# Patient Record
Sex: Female | Born: 1980 | Race: White | Hispanic: No | Marital: Single | Smoking: Former smoker
Health system: Southern US, Community
[De-identification: ages and names within clinical notes are randomized; demographics above are authoritative.]

## PROBLEM LIST (undated history)

## (undated) DIAGNOSIS — Z915 Personal history of self-harm: Secondary | ICD-10-CM

## (undated) DIAGNOSIS — G5601 Carpal tunnel syndrome, right upper limb: Secondary | ICD-10-CM

## (undated) DIAGNOSIS — Z9151 Personal history of suicidal behavior: Secondary | ICD-10-CM

## (undated) DIAGNOSIS — K769 Liver disease, unspecified: Secondary | ICD-10-CM

## (undated) DIAGNOSIS — N809 Endometriosis, unspecified: Secondary | ICD-10-CM

## (undated) DIAGNOSIS — K5790 Diverticulosis of intestine, part unspecified, without perforation or abscess without bleeding: Secondary | ICD-10-CM

---

## 2000-01-15 ENCOUNTER — Emergency Department (HOSPITAL_COMMUNITY): Admission: EM | Admit: 2000-01-15 | Discharge: 2000-01-15 | Payer: Self-pay | Admitting: *Deleted

## 2000-11-22 ENCOUNTER — Emergency Department (HOSPITAL_COMMUNITY): Admission: EM | Admit: 2000-11-22 | Discharge: 2000-11-22 | Payer: Self-pay | Admitting: Emergency Medicine

## 2000-12-05 ENCOUNTER — Emergency Department (HOSPITAL_COMMUNITY): Admission: EM | Admit: 2000-12-05 | Discharge: 2000-12-05 | Payer: Self-pay | Admitting: Emergency Medicine

## 2001-04-02 ENCOUNTER — Emergency Department (HOSPITAL_COMMUNITY): Admission: EM | Admit: 2001-04-02 | Discharge: 2001-04-02 | Payer: Self-pay | Admitting: Emergency Medicine

## 2002-08-25 ENCOUNTER — Emergency Department (HOSPITAL_COMMUNITY): Admission: EM | Admit: 2002-08-25 | Discharge: 2002-08-25 | Payer: Self-pay | Admitting: Emergency Medicine

## 2002-10-29 ENCOUNTER — Emergency Department (HOSPITAL_COMMUNITY): Admission: EM | Admit: 2002-10-29 | Discharge: 2002-10-29 | Payer: Self-pay | Admitting: Emergency Medicine

## 2002-12-08 ENCOUNTER — Emergency Department (HOSPITAL_COMMUNITY): Admission: EM | Admit: 2002-12-08 | Discharge: 2002-12-08 | Payer: Self-pay

## 2002-12-17 ENCOUNTER — Emergency Department (HOSPITAL_COMMUNITY): Admission: EM | Admit: 2002-12-17 | Discharge: 2002-12-17 | Payer: Self-pay | Admitting: Emergency Medicine

## 2003-02-03 ENCOUNTER — Inpatient Hospital Stay (HOSPITAL_COMMUNITY): Admission: AD | Admit: 2003-02-03 | Discharge: 2003-02-03 | Payer: Self-pay | Admitting: Obstetrics and Gynecology

## 2003-02-03 ENCOUNTER — Encounter: Payer: Self-pay | Admitting: Emergency Medicine

## 2003-02-08 ENCOUNTER — Emergency Department (HOSPITAL_COMMUNITY): Admission: EM | Admit: 2003-02-08 | Discharge: 2003-02-09 | Payer: Self-pay | Admitting: Emergency Medicine

## 2003-02-10 ENCOUNTER — Inpatient Hospital Stay (HOSPITAL_COMMUNITY): Admission: AD | Admit: 2003-02-10 | Discharge: 2003-02-10 | Payer: Self-pay | Admitting: Obstetrics and Gynecology

## 2003-02-13 ENCOUNTER — Inpatient Hospital Stay (HOSPITAL_COMMUNITY): Admission: AD | Admit: 2003-02-13 | Discharge: 2003-02-13 | Payer: Self-pay | Admitting: Family Medicine

## 2003-02-17 ENCOUNTER — Inpatient Hospital Stay (HOSPITAL_COMMUNITY): Admission: AD | Admit: 2003-02-17 | Discharge: 2003-02-17 | Payer: Self-pay | Admitting: Obstetrics and Gynecology

## 2003-03-12 ENCOUNTER — Inpatient Hospital Stay (HOSPITAL_COMMUNITY): Admission: AD | Admit: 2003-03-12 | Discharge: 2003-03-12 | Payer: Self-pay | Admitting: *Deleted

## 2003-03-28 ENCOUNTER — Emergency Department (HOSPITAL_COMMUNITY): Admission: EM | Admit: 2003-03-28 | Discharge: 2003-03-28 | Payer: Self-pay | Admitting: Emergency Medicine

## 2003-04-02 ENCOUNTER — Emergency Department (HOSPITAL_COMMUNITY): Admission: EM | Admit: 2003-04-02 | Discharge: 2003-04-02 | Payer: Self-pay | Admitting: Family Medicine

## 2003-04-09 ENCOUNTER — Emergency Department (HOSPITAL_COMMUNITY): Admission: EM | Admit: 2003-04-09 | Discharge: 2003-04-09 | Payer: Self-pay | Admitting: Family Medicine

## 2003-04-15 ENCOUNTER — Emergency Department (HOSPITAL_COMMUNITY): Admission: AD | Admit: 2003-04-15 | Discharge: 2003-04-15 | Payer: Self-pay | Admitting: Family Medicine

## 2003-06-29 ENCOUNTER — Inpatient Hospital Stay (HOSPITAL_COMMUNITY): Admission: AD | Admit: 2003-06-29 | Discharge: 2003-06-29 | Payer: Self-pay | Admitting: *Deleted

## 2003-08-29 ENCOUNTER — Emergency Department (HOSPITAL_COMMUNITY): Admission: EM | Admit: 2003-08-29 | Discharge: 2003-08-29 | Payer: Self-pay | Admitting: Emergency Medicine

## 2003-09-20 ENCOUNTER — Inpatient Hospital Stay (HOSPITAL_COMMUNITY): Admission: AD | Admit: 2003-09-20 | Discharge: 2003-09-21 | Payer: Self-pay | Admitting: Obstetrics and Gynecology

## 2003-09-21 ENCOUNTER — Inpatient Hospital Stay (HOSPITAL_COMMUNITY): Admission: AD | Admit: 2003-09-21 | Discharge: 2003-09-21 | Payer: Self-pay | Admitting: *Deleted

## 2003-09-23 ENCOUNTER — Inpatient Hospital Stay (HOSPITAL_COMMUNITY): Admission: AD | Admit: 2003-09-23 | Discharge: 2003-09-23 | Payer: Self-pay | Admitting: Obstetrics and Gynecology

## 2003-09-25 ENCOUNTER — Inpatient Hospital Stay (HOSPITAL_COMMUNITY): Admission: AD | Admit: 2003-09-25 | Discharge: 2003-09-25 | Payer: Self-pay | Admitting: Obstetrics & Gynecology

## 2003-10-07 ENCOUNTER — Inpatient Hospital Stay (HOSPITAL_COMMUNITY): Admission: AD | Admit: 2003-10-07 | Discharge: 2003-10-07 | Payer: Self-pay | Admitting: Gynecology

## 2003-12-27 ENCOUNTER — Inpatient Hospital Stay (HOSPITAL_COMMUNITY): Admission: AD | Admit: 2003-12-27 | Discharge: 2003-12-27 | Payer: Self-pay | Admitting: Obstetrics and Gynecology

## 2004-01-19 ENCOUNTER — Inpatient Hospital Stay (HOSPITAL_COMMUNITY): Admission: AD | Admit: 2004-01-19 | Discharge: 2004-01-19 | Payer: Self-pay | Admitting: Obstetrics and Gynecology

## 2004-01-27 ENCOUNTER — Inpatient Hospital Stay (HOSPITAL_COMMUNITY): Admission: AD | Admit: 2004-01-27 | Discharge: 2004-01-27 | Payer: Self-pay | Admitting: *Deleted

## 2004-02-08 ENCOUNTER — Ambulatory Visit: Payer: Self-pay | Admitting: Obstetrics and Gynecology

## 2004-03-13 ENCOUNTER — Ambulatory Visit: Payer: Self-pay | Admitting: Obstetrics and Gynecology

## 2004-03-13 ENCOUNTER — Ambulatory Visit (HOSPITAL_COMMUNITY): Admission: RE | Admit: 2004-03-13 | Discharge: 2004-03-13 | Payer: Self-pay | Admitting: Obstetrics and Gynecology

## 2004-03-13 HISTORY — PX: DIAGNOSTIC LAPAROSCOPY: SUR761

## 2004-03-15 ENCOUNTER — Inpatient Hospital Stay (HOSPITAL_COMMUNITY): Admission: AD | Admit: 2004-03-15 | Discharge: 2004-03-15 | Payer: Self-pay | Admitting: Family Medicine

## 2004-03-30 ENCOUNTER — Ambulatory Visit: Payer: Self-pay | Admitting: Obstetrics and Gynecology

## 2004-05-15 ENCOUNTER — Emergency Department (HOSPITAL_COMMUNITY): Admission: EM | Admit: 2004-05-15 | Discharge: 2004-05-15 | Payer: Self-pay | Admitting: Family Medicine

## 2004-05-23 ENCOUNTER — Emergency Department (HOSPITAL_COMMUNITY): Admission: EM | Admit: 2004-05-23 | Discharge: 2004-05-23 | Payer: Self-pay | Admitting: Family Medicine

## 2004-06-20 ENCOUNTER — Ambulatory Visit: Payer: Self-pay | Admitting: *Deleted

## 2004-07-20 ENCOUNTER — Emergency Department (HOSPITAL_COMMUNITY): Admission: EM | Admit: 2004-07-20 | Discharge: 2004-07-20 | Payer: Self-pay | Admitting: Emergency Medicine

## 2004-08-15 ENCOUNTER — Emergency Department (HOSPITAL_COMMUNITY): Admission: EM | Admit: 2004-08-15 | Discharge: 2004-08-15 | Payer: Self-pay | Admitting: Family Medicine

## 2004-08-18 ENCOUNTER — Emergency Department (HOSPITAL_COMMUNITY): Admission: EM | Admit: 2004-08-18 | Discharge: 2004-08-18 | Payer: Self-pay | Admitting: Family Medicine

## 2004-08-19 ENCOUNTER — Emergency Department (HOSPITAL_COMMUNITY): Admission: EM | Admit: 2004-08-19 | Discharge: 2004-08-19 | Payer: Self-pay | Admitting: Emergency Medicine

## 2004-08-20 ENCOUNTER — Emergency Department (HOSPITAL_COMMUNITY): Admission: EM | Admit: 2004-08-20 | Discharge: 2004-08-20 | Payer: Self-pay | Admitting: Family Medicine

## 2004-09-11 ENCOUNTER — Emergency Department (HOSPITAL_COMMUNITY): Admission: EM | Admit: 2004-09-11 | Discharge: 2004-09-11 | Payer: Self-pay | Admitting: *Deleted

## 2004-09-11 ENCOUNTER — Encounter: Payer: Self-pay | Admitting: Obstetrics and Gynecology

## 2004-10-11 ENCOUNTER — Emergency Department (HOSPITAL_COMMUNITY): Admission: EM | Admit: 2004-10-11 | Discharge: 2004-10-11 | Payer: Self-pay | Admitting: Family Medicine

## 2004-11-19 ENCOUNTER — Inpatient Hospital Stay (HOSPITAL_COMMUNITY): Admission: AD | Admit: 2004-11-19 | Discharge: 2004-11-19 | Payer: Self-pay | Admitting: Obstetrics and Gynecology

## 2004-11-24 ENCOUNTER — Emergency Department (HOSPITAL_COMMUNITY): Admission: EM | Admit: 2004-11-24 | Discharge: 2004-11-24 | Payer: Self-pay | Admitting: Emergency Medicine

## 2005-01-30 ENCOUNTER — Emergency Department (HOSPITAL_COMMUNITY): Admission: EM | Admit: 2005-01-30 | Discharge: 2005-01-30 | Payer: Self-pay | Admitting: Family Medicine

## 2005-02-25 ENCOUNTER — Emergency Department (HOSPITAL_COMMUNITY): Admission: EM | Admit: 2005-02-25 | Discharge: 2005-02-25 | Payer: Self-pay | Admitting: Family Medicine

## 2005-02-26 ENCOUNTER — Ambulatory Visit (HOSPITAL_COMMUNITY): Admission: RE | Admit: 2005-02-26 | Discharge: 2005-02-26 | Payer: Self-pay | Admitting: Family Medicine

## 2005-02-28 ENCOUNTER — Inpatient Hospital Stay (HOSPITAL_COMMUNITY): Admission: AD | Admit: 2005-02-28 | Discharge: 2005-02-28 | Payer: Self-pay | Admitting: Obstetrics and Gynecology

## 2005-03-01 ENCOUNTER — Emergency Department (HOSPITAL_COMMUNITY): Admission: EM | Admit: 2005-03-01 | Discharge: 2005-03-02 | Payer: Self-pay | Admitting: Emergency Medicine

## 2005-03-21 ENCOUNTER — Emergency Department (HOSPITAL_COMMUNITY): Admission: EM | Admit: 2005-03-21 | Discharge: 2005-03-21 | Payer: Self-pay | Admitting: Emergency Medicine

## 2005-05-11 ENCOUNTER — Emergency Department (HOSPITAL_COMMUNITY): Admission: EM | Admit: 2005-05-11 | Discharge: 2005-05-11 | Payer: Self-pay | Admitting: Family Medicine

## 2005-06-25 ENCOUNTER — Emergency Department (HOSPITAL_COMMUNITY): Admission: EM | Admit: 2005-06-25 | Discharge: 2005-06-25 | Payer: Self-pay | Admitting: Emergency Medicine

## 2005-08-03 ENCOUNTER — Emergency Department (HOSPITAL_COMMUNITY): Admission: EM | Admit: 2005-08-03 | Discharge: 2005-08-03 | Payer: Self-pay | Admitting: Emergency Medicine

## 2005-08-24 ENCOUNTER — Encounter (INDEPENDENT_AMBULATORY_CARE_PROVIDER_SITE_OTHER): Payer: Self-pay | Admitting: Specialist

## 2005-08-24 ENCOUNTER — Ambulatory Visit: Payer: Self-pay | Admitting: Family Medicine

## 2005-08-24 ENCOUNTER — Ambulatory Visit (HOSPITAL_COMMUNITY): Admission: AD | Admit: 2005-08-24 | Discharge: 2005-08-24 | Payer: Self-pay | Admitting: Family Medicine

## 2005-08-24 HISTORY — PX: SALPINGECTOMY: SHX328

## 2005-08-26 ENCOUNTER — Inpatient Hospital Stay (HOSPITAL_COMMUNITY): Admission: AD | Admit: 2005-08-26 | Discharge: 2005-08-26 | Payer: Self-pay | Admitting: Obstetrics and Gynecology

## 2005-08-28 ENCOUNTER — Inpatient Hospital Stay (HOSPITAL_COMMUNITY): Admission: AD | Admit: 2005-08-28 | Discharge: 2005-08-29 | Payer: Self-pay | Admitting: Obstetrics and Gynecology

## 2005-08-30 ENCOUNTER — Inpatient Hospital Stay (HOSPITAL_COMMUNITY): Admission: AD | Admit: 2005-08-30 | Discharge: 2005-08-30 | Payer: Self-pay | Admitting: Family Medicine

## 2005-09-05 ENCOUNTER — Emergency Department (HOSPITAL_COMMUNITY): Admission: EM | Admit: 2005-09-05 | Discharge: 2005-09-05 | Payer: Self-pay | Admitting: Family Medicine

## 2005-09-12 ENCOUNTER — Ambulatory Visit: Payer: Self-pay | Admitting: Gynecology

## 2006-06-19 IMAGING — CT CT PELVIS W/O CM
1 of 2 series · 15 of 32 positions shown, 20 images · IV contrast (agent unspecified)
Comparison: none

CLINICAL DATA: Left flank pain.
 ABDOMEN CT WITHOUT CONTRAST:
TECHNIQUE: Multidetector CT imaging of the abdomen was performed following the standard protocol without IV contrast.  
 There is a calcified granuloma in the spleen.  The spleen is otherwise normal.  The liver, pancreas, adrenal glands, and kidneys appear normal.  No renal calculus or hydronephrosis.  No dilated loops of large or small bowel.  The bony structures are normal.  The appendix and terminal ileum are normal.
TECHNIQUE: Multidetector CT imaging of the pelvis was performed following the standard protocol without IV contrast. 
 The uterus and ovaries are normal.  No distal ureteral calculi or bladder calculi.  No bony abnormality of significance.

[Series 2: routine abdomen · axial · 0.69mm/px · z∈[-438,-68]mm · 15 of 82 slices shown, 20 images]
[im 4/82  soft-tissue]
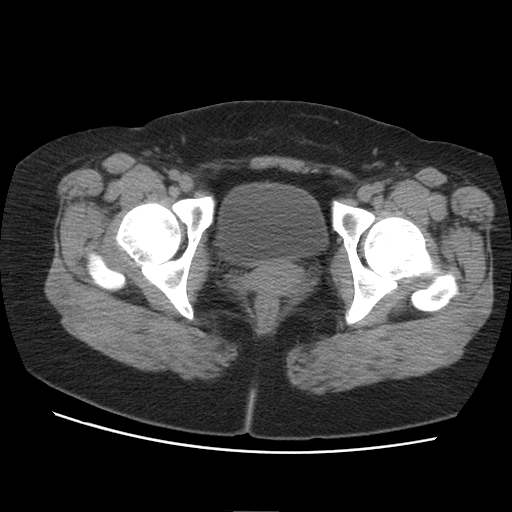
[im 4/82  bone]
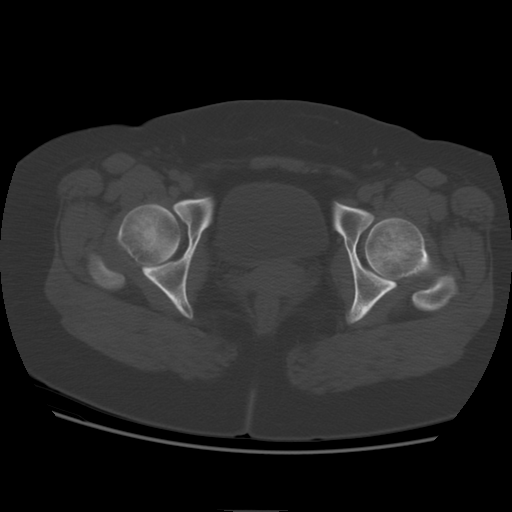
[im 11/82  soft-tissue]
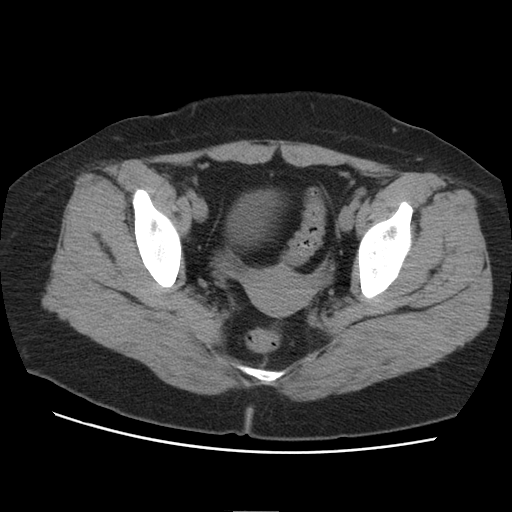
[im 15/82  soft-tissue]
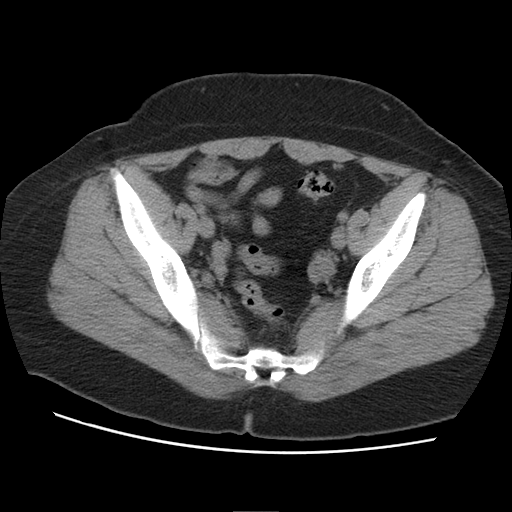
[im 22/82  soft-tissue]
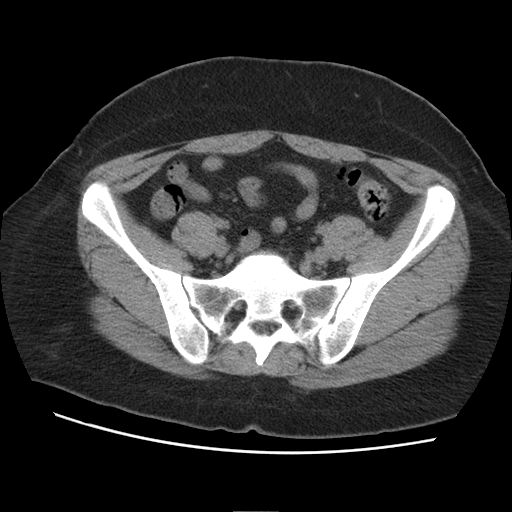
[im 29/82  soft-tissue]
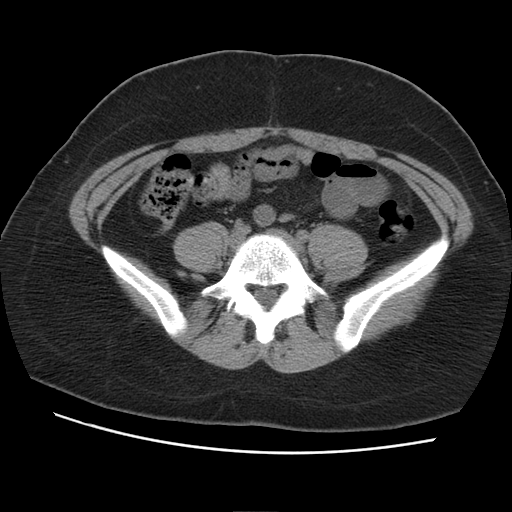
[im 32/82  soft-tissue]
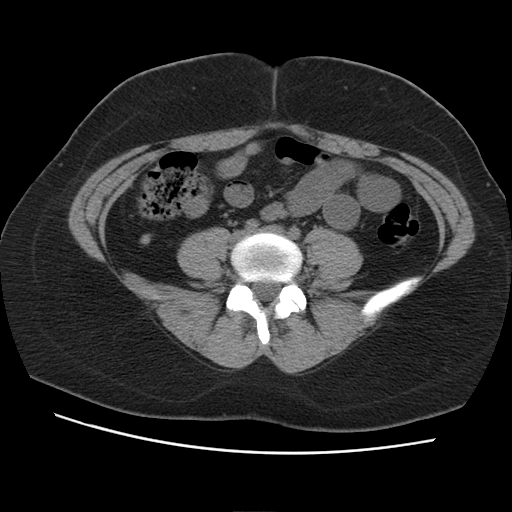
[im 39/82  soft-tissue]
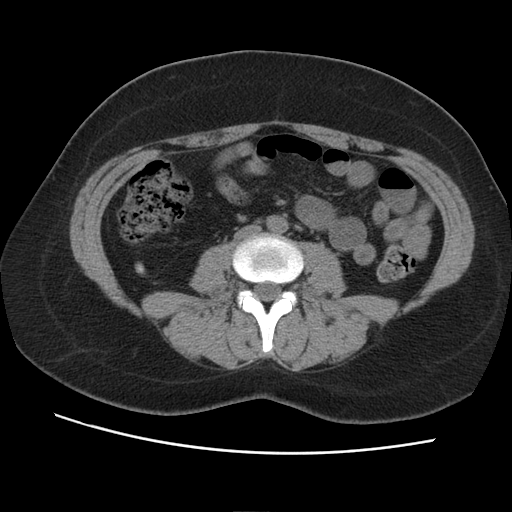
[im 43/82  soft-tissue]
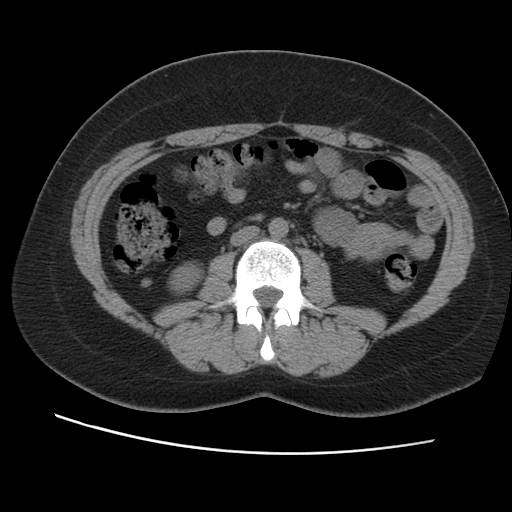
[im 50/82  soft-tissue]
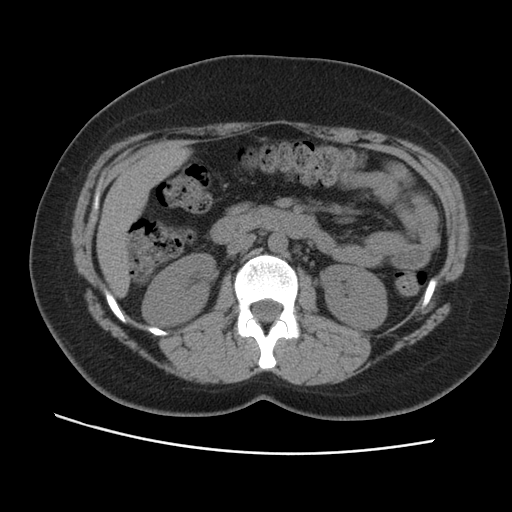
[im 50/82  bone]
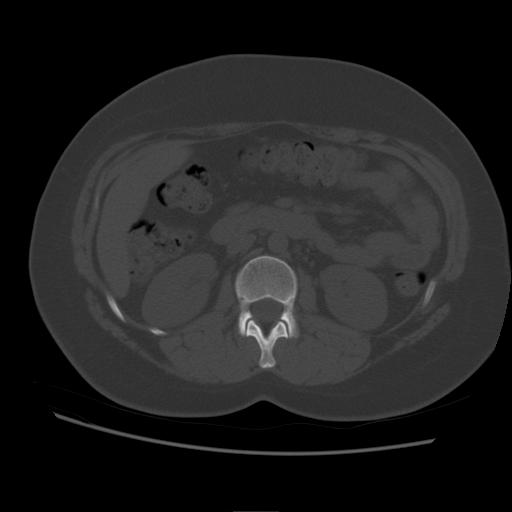
[im 53/82  soft-tissue]
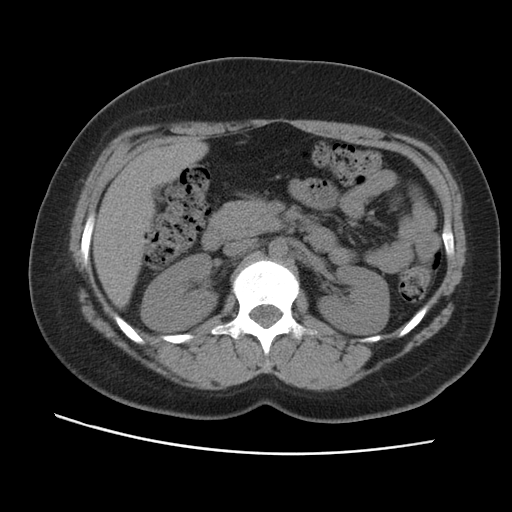
[im 60/82  soft-tissue]
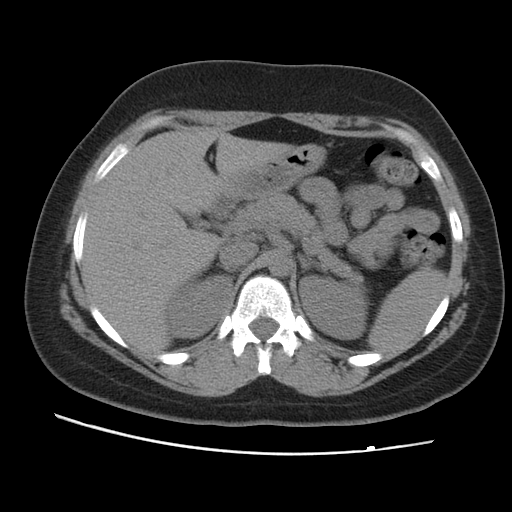
[im 67/82  soft-tissue]
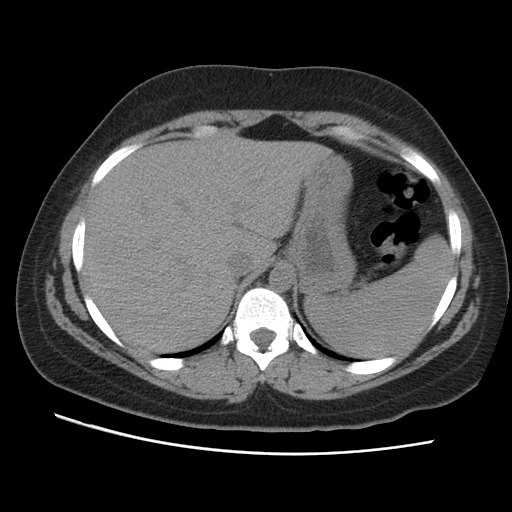
[im 67/82  lung]
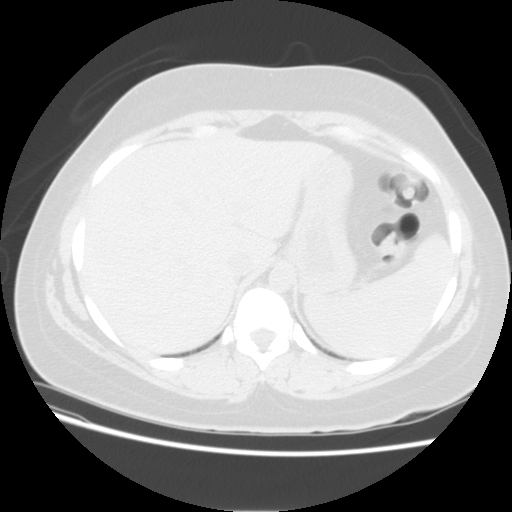
[im 71/82  soft-tissue]
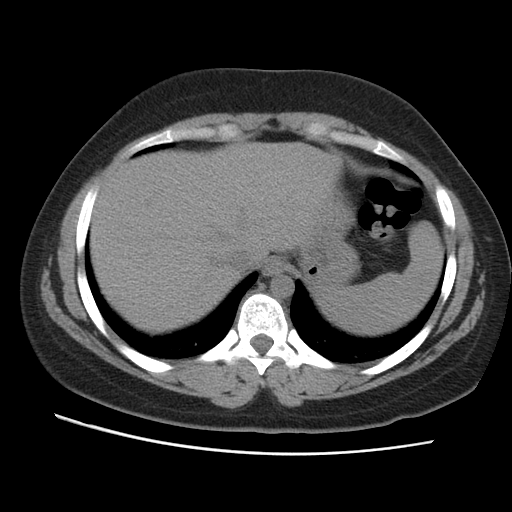
[im 71/82  lung]
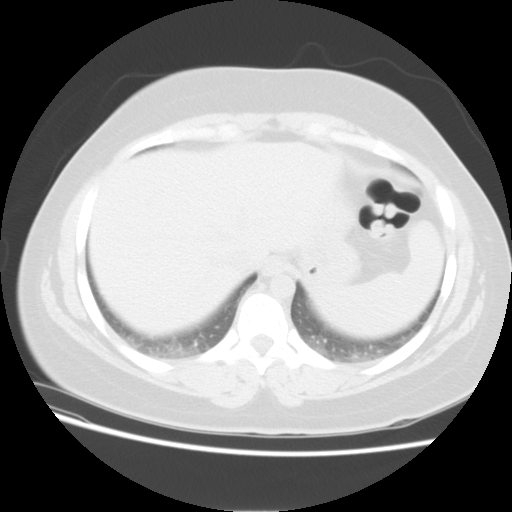
[im 74/82  lung]
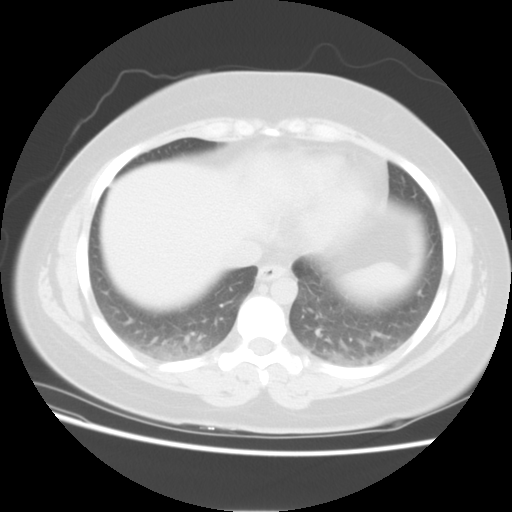
[im 78/82  soft-tissue]
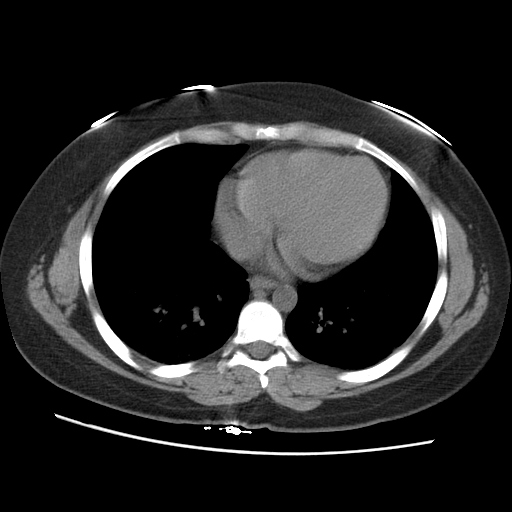
[im 78/82  lung]
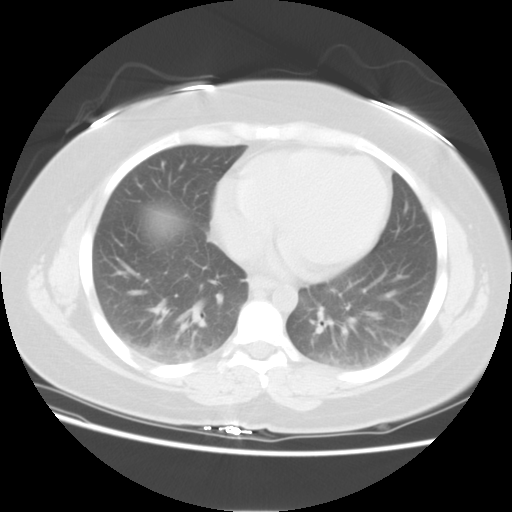

[15 of 32 positions shown; findings below may reference images not displayed]

IMPRESSION: Normal CT scan of the pelvis. 
 PELVIS CT WITHOUT CONTRAST:
IMPRESSION: Normal exam.

## 2007-05-23 ENCOUNTER — Inpatient Hospital Stay (HOSPITAL_COMMUNITY): Admission: AD | Admit: 2007-05-23 | Discharge: 2007-05-23 | Payer: Self-pay | Admitting: Gynecology

## 2007-06-03 IMAGING — CR DG ABDOMEN 1V
1 series · 1 of 1 positions shown · non-contrast
Comparison: CT of 09/11/04.

CLINICAL DATA: Ectopic pregnancy.  Surgery two days ago.  Now having periumbilical pain, nausea, and vomiting. 
 ABDOMEN ? 1 VIEW:

[view not recorded]
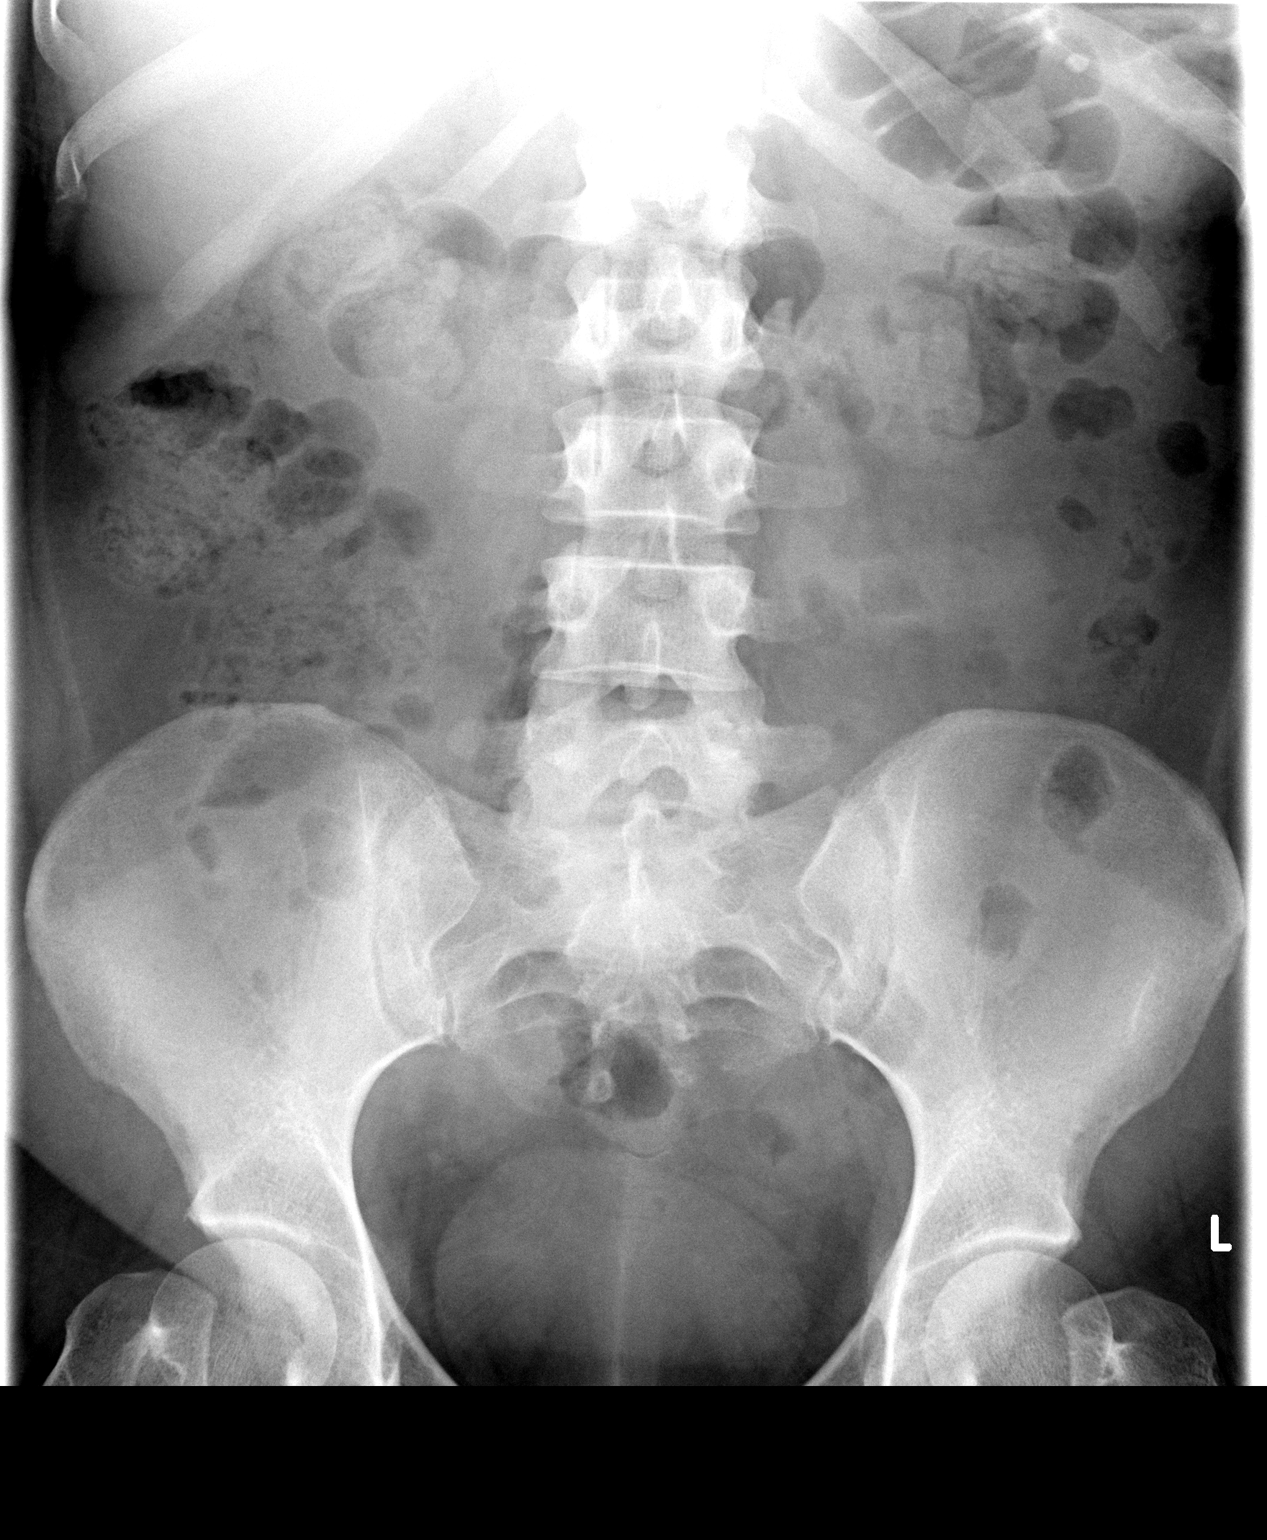

[1 of 1 positions shown; findings below may reference images not displayed]

FINDINGS: Unremarkable bowel gas pattern.  Stool and gas throughout the colon.  Distal gas identified. No significant small bowel distention.  No abnormal calcifications.  No evidence of free intraperitoneal air.
IMPRESSION: No evidence of bowel obstruction or free air.

## 2007-10-30 ENCOUNTER — Inpatient Hospital Stay (HOSPITAL_COMMUNITY): Admission: AD | Admit: 2007-10-30 | Discharge: 2007-10-30 | Payer: Self-pay | Admitting: Gynecology

## 2009-02-27 IMAGING — US US PELVIS COMPLETE MODIFY
1 series · 14 of 25 positions shown · non-contrast
Comparison: CT abdomen and pelvis 09/11/2004.

CLINICAL DATA: Left lower quadrant abdominal pain and left pelvic
pain.  History of free ectopic pregnancies, status post left
salpingectomy.  LMP 04/03/2007.

TRANSABDOMINAL AND TRANSVAGINAL ULTRASOUND OF PELVIS 05/23/2007:
TECHNIQUE: Both transabdominal and transvaginal ultrasound
examinations of the pelvis were performed including evaluation of
the uterus, ovaries, adnexal regions, and pelvic cul-de-sac.

[Series 1: us pelvis complete modify · 0.26mm/px · 14 of 39 slices shown]
[im 1/39]
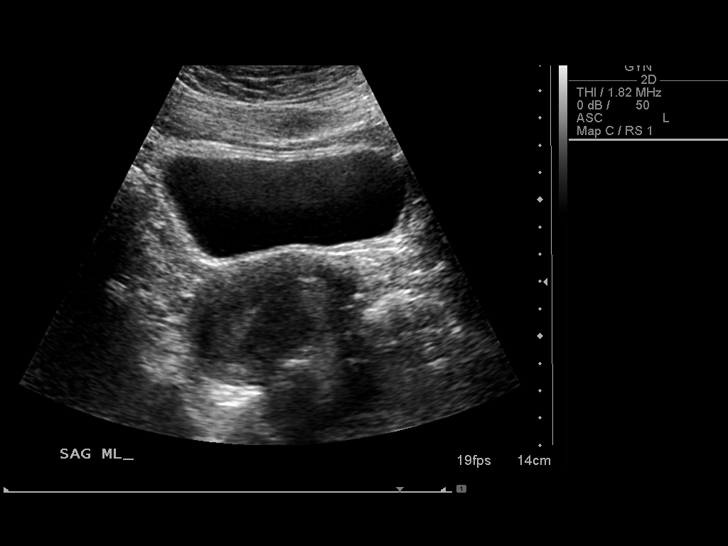
[im 4/39]
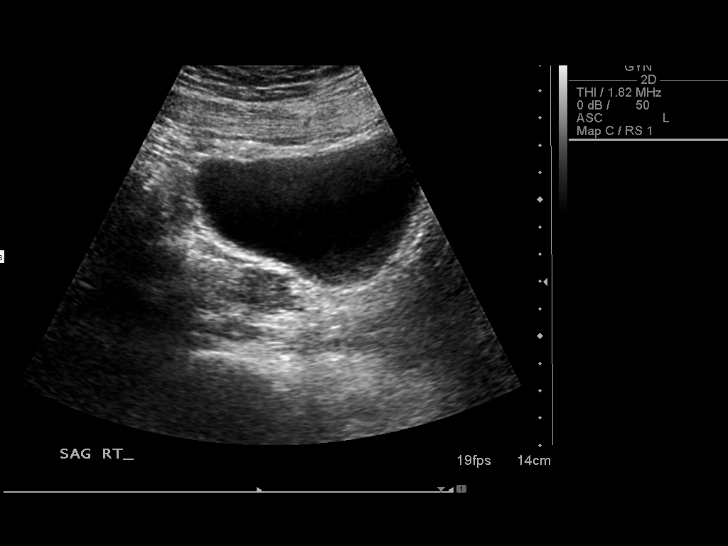
[im 7/39]
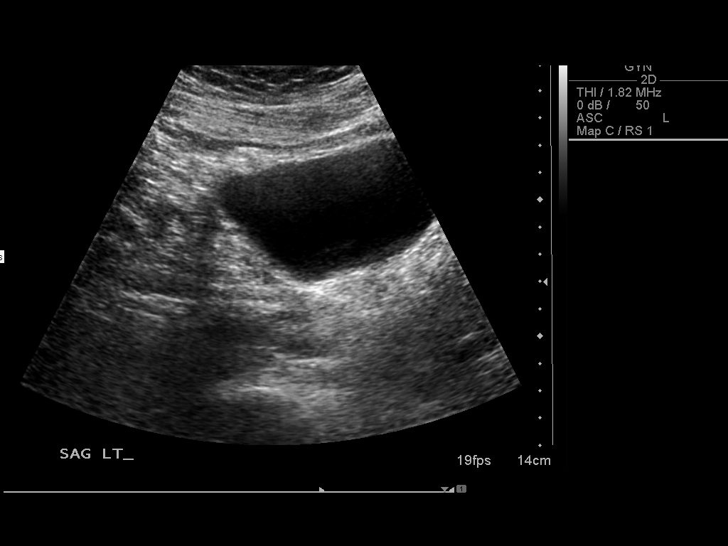
[im 10/39]
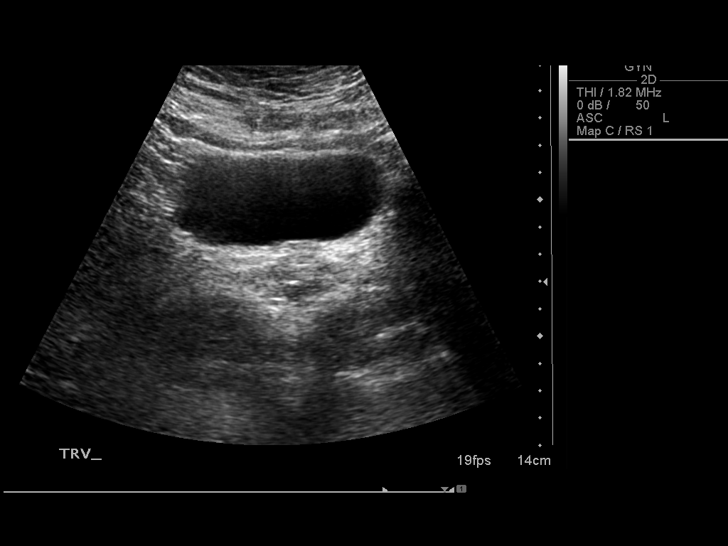
[im 13/39]
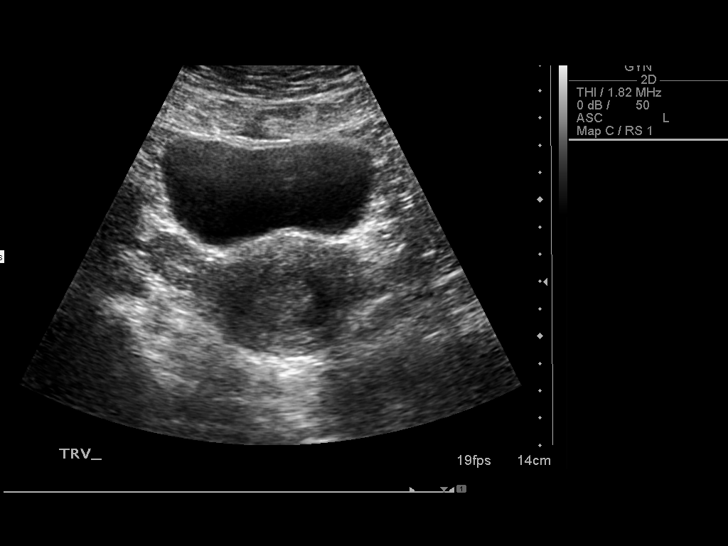
[im 15/39]
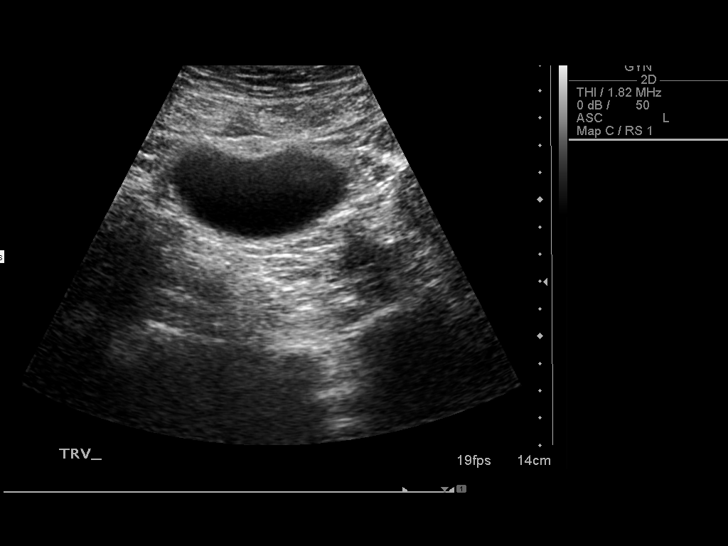
[im 18/39]
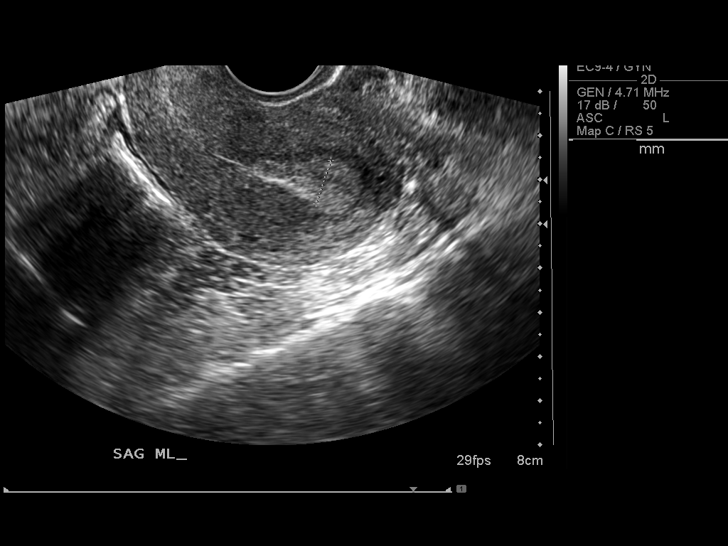
[im 21/39]
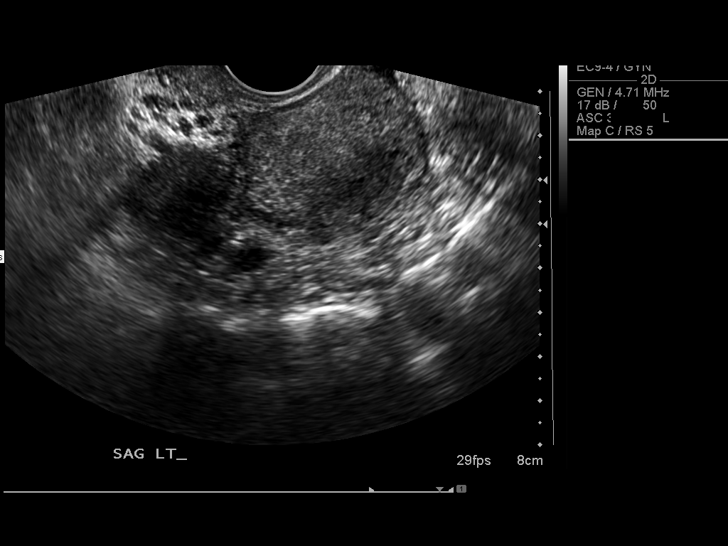
[im 24/39]
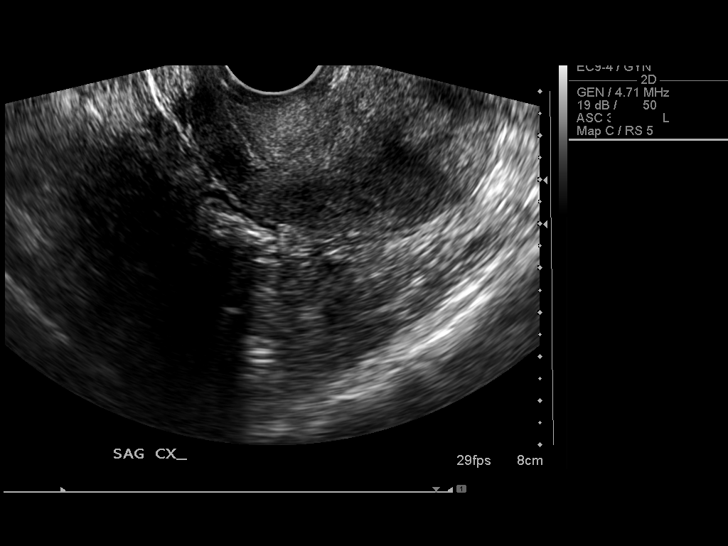
[im 26/39]
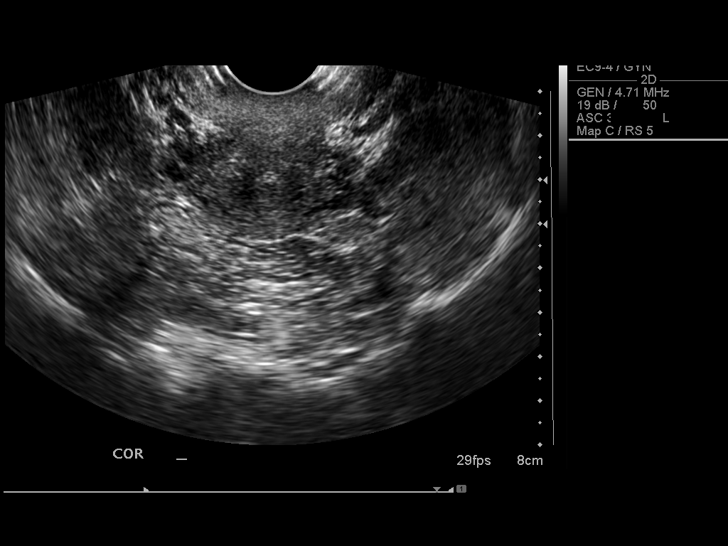
[im 29/39]
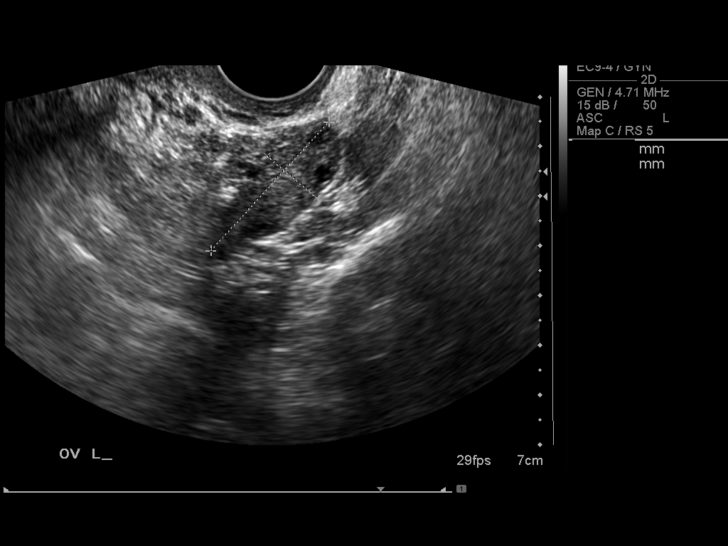
[im 32/39]
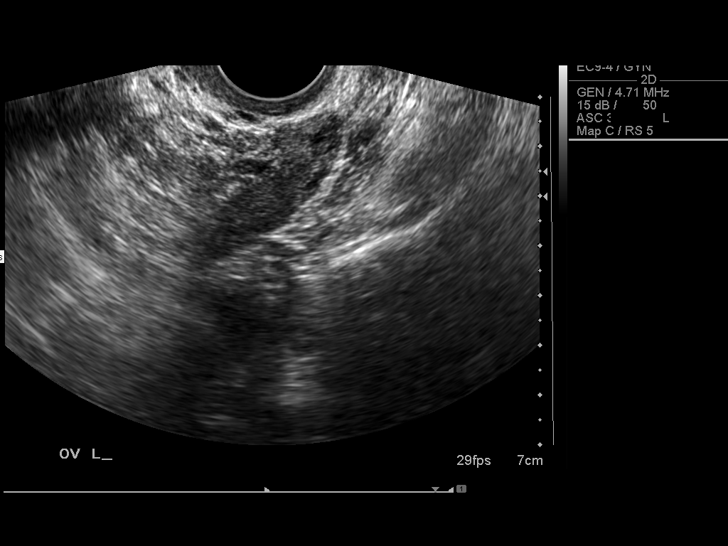
[im 35/39]
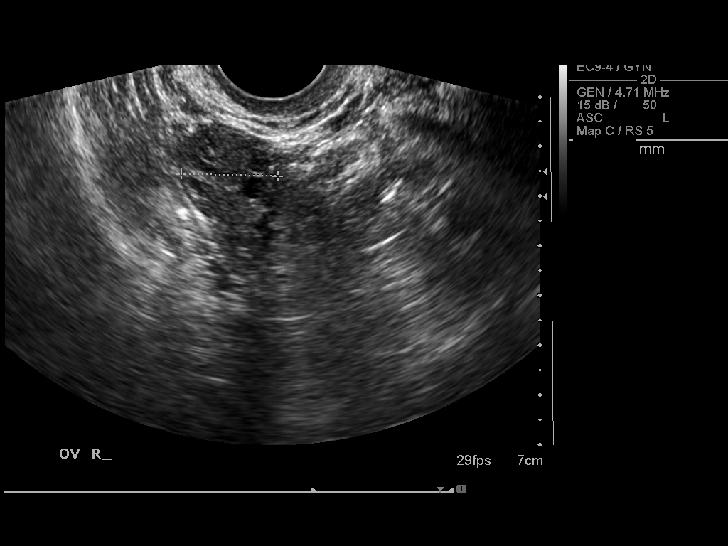
[im 39/39]
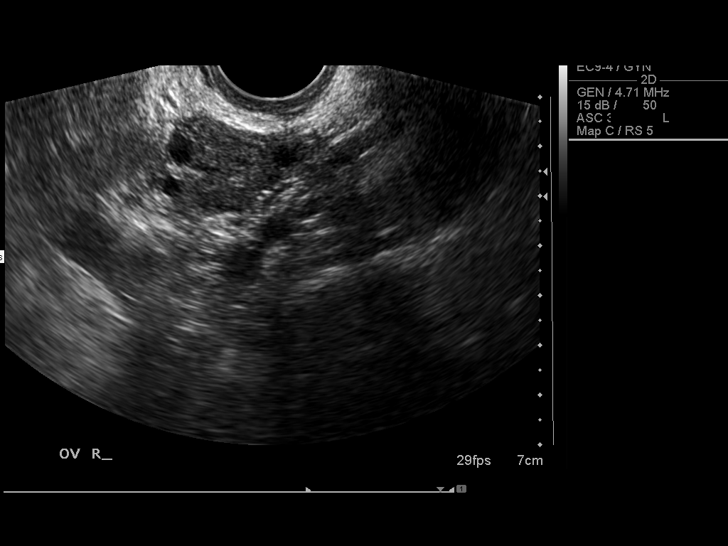

[14 of 25 positions shown; findings below may reference images not displayed]

FINDINGS: Uterus normal in size and appearance, measuring
approximately 7.0 x 4.1 x 5.1 cm.  Endometrium normal in appearance
measuring approximately 8 mm in thickness.  No endometrial fluid or
masses.  No myometrial masses.  Uterine cervix unremarkable.

Left ovary normal in size and appearance measuring approximately
3.5 x 1.3 x 1.3 cm.  It contains several small follicular cysts.
Right ovary normal in size and appearance measuring approximately
3.0 x 1.8 x 1.9 cm.  It also contains several small follicular
cysts.  No adnexal masses or free fluid.
IMPRESSION: Normal pelvic ultrasound.

## 2009-04-13 ENCOUNTER — Emergency Department (HOSPITAL_COMMUNITY): Admission: EM | Admit: 2009-04-13 | Discharge: 2009-04-13 | Payer: Self-pay | Admitting: Emergency Medicine

## 2010-06-30 NOTE — Op Note (Signed)
Karen Flowers, Karen Flowers              ACCOUNT NO.:  [id number]   MEDICAL RECORD NO.:  [id number]          PATIENT TYPE:  AMB   LOCATION:  [facility]                           FACILITY:  [facility]   PHYSICIAN:  Phil D. Okey Dupre, M.D.     DATE OF BIRTH:  07-16-1980   DATE OF PROCEDURE:  03/13/2004  DATE OF DISCHARGE:                                 OPERATIVE REPORT   PROCEDURE:  Diagnostic laparoscopy and examination under anesthesia.   PREOPERATIVE DIAGNOSES:  Chronic pelvic pain with increasing dysmenorrhea  and dyspareunia.   POSTOPERATIVE DIAGNOSES:  Normal pelvis with minimal endometriosis.   ANESTHESIA:  General.   ESTIMATED BLOOD LOSS:  Minimal.   SURGEON:  Dr. Okey Dupre.   SURGICAL SPECIMENS:  No surgical specimens taken.   OPERATIVE FINDINGS:  Bimanual pelvic examination of the uterus was in first  degree retroversion but this uterus was easily moveable and mobile and  easily pushed outside of the pelvis.  The adnexa seemed to be normal.  On  laparoscopic examination, the pelvis was completely normal in appearance  with perhaps a slightly hyperemic uterus, possibly secondary to the  beginning of a period.  The only abnormality noted was behind the left broad  ligament in the lateral area, there was multiple endometrial implants which  I would have to classify as minimal endometriosis however, the uterosacral  ligaments, the cul-de-sac, the bladder reflection, the ovaries all were  normal with no other side of endometriosis.  There was a small irregularity  in the mid portion of the right fallopian tube, two lump scars secondary to  a previous ectopic pregnancy.   DESCRIPTION OF PROCEDURE:  Under satisfactory general anesthesia, the  patient was placed in the dorsal lithotomy position.  The peroneum, vagina  and abdomen were prepped and draped in the usual sterile manner.  The  bladder was emptied.  Bimanual pelvic examination was just as  aforementioned.  A tenaculum acorn was placed into  the uterus, tested the  tenaculum for mobilization of the uterus and a Veress needle inserted into  the peritoneal cavity.  The lower pole of the umbilicus after a 1 cm  transverse incision had been made.  The CO2 was injected into the peritoneal  cavity.  DeLee tympany occurred over the entire abdominal wall.  This Veress  needle was removed.  The laparoscope trocar inserted into the peritoneal  cavity.  The trocar was removed from the sleeve and the laparoscope inserted  and findings were as aforementioned.  As much CO2 then was expressed through  the opening.  The scope was removed.  The sleeve was then removed and the  incision closed with a 2-0 Vicryl figure-of-eight suture closing the fascia  which was continued up to a subcuticular closure.  Dry sterile dressing was  applied.  The patient went to  the recovery room in satisfactory condition having tolerated the procedure  well with minimal blood loss.  Our plan is to give the patient 11.25 mg IM  of Depo-Lupron prior to her leaving the hospital and following her in two  weeks in the clinic.  Depending on success, may keep her on Depo-Lupron for  three to six months.      PDR/MEDQ  D:  03/13/2004  T:  03/13/2004  Job:  [id number]

## 2010-06-30 NOTE — Group Therapy Note (Signed)
Karen Flowers, Karen Flowers              ACCOUNT NO.:  [id number]   MEDICAL RECORD NO.:  [id number]          PATIENT TYPE:  WOC   LOCATION:  [facility]                   FACILITY:  [facility]   PHYSICIAN:  Argentina Donovan, MD        DATE OF BIRTH:  1980-09-15   DATE OF SERVICE:  02/08/2004                                    CLINIC NOTE   REASON FOR VISIT:  The patient is a 30 year old gravida 4, para 1-0-3-1 who  has had 2 ectopic pregnancies, 1 treated with methotrexate and 1 with  laparoscopy and one spontaneous miscarriage.  The pregnancy occurred 8 years  ago when she was very young and the baby was given up for adoption.  Since  that time she has been trying to get pregnant again and does not want to try  or take oral contraceptives.  She has dyspareunia and dysmenorrhea that has  been increasing over the years.  Her periods are fairly regular otherwise.  She would like to know whether there is some hormonal reason or anatomical  reason such as endometriosis or pelvic adhesions for her difficulty in  conceiving and her pain with intercourse which has gotten worse over the  last 2 years.  Interestingly enough she has a retroverted uterus and it does  not move easily out of the cul-de-sac although I feel no uterosacral nodules  on examination.   PHYSICAL EXAMINATION:  ABDOMEN:  Soft, flat, nontender, no mass or  organomegaly.  PELVIC:  The external genitalia is normal, BUS within normal limits.  The  vagina is clean and well rugated.  The cervix is clean, parous and the  uterus is retroverted but normal size, shape and consistency with normal  adnexa.   IMPRESSION:  Dysmenorrhea, dyspareunia and secondary infertility.      PR/MEDQ  D:  02/08/2004  T:  02/08/2004  Job:  [id number]

## 2010-06-30 NOTE — Op Note (Signed)
NAMEDESYRE, CALMA                 ACCOUNT NO.:  [id number]   MEDICAL RECORD NO.:  [id number]          PATIENT TYPE:  MAT   LOCATION:  [facility]                          FACILITY:  [facility]   PHYSICIAN:  Tanya S. Shawnie Pons, M.D.   DATE OF BIRTH:  Sep 25, 1980   DATE OF PROCEDURE:  08/24/2005  DATE OF DISCHARGE:                                 OPERATIVE REPORT   PREOPERATIVE DIAGNOSIS:  Right ectopic pregnancy.   POSTOPERATIVE DIAGNOSIS:  Right ectopic pregnancy.   PROCEDURE:  Laparoscopic right salpingectomy.   SURGEON:  Shelbie Proctor. Shawnie Pons, M.D.   ASSISTANT:  Marc Morgans. Mayford Knife, M.D.   ANESTHESIA:  General and local.   SPECIMENS:  Right tube to pathology.   ESTIMATED BLOOD LOSS:  1000 mL.   COMPLICATIONS:  None.   INDICATIONS FOR PROCEDURE:  The patient is a 30 year old, gravida 5, para 1-  0-3-1 whose had two prior ectopic pregnancies who presents with right-sided  abdominal pain. She is unsure of her dates, her beta HCG is 136 however. She  has a 3 cm right adnexal mass and free fluid in the belly. After informed  consent, the patient was taken to the operating room.   DESCRIPTION OF PROCEDURE:  The patient was taken to the OR, she was placed  in dorsal lithotomy in [place] stirrups. She was prepped and draped in the  usual sterile fashion and a Foley catheter was placed inside the bladder. A  ring forceps with a sponge on it was placed inside the vagina. Attention was  then turned to the abdomen. 4 mL of 0.25% Marcaine with epinephrine was  injected infraumbilically. Two Allis clamps were used to elevate the  umbilicus and an incision was made through the umbilicus. This was carried  down to the underlying fascia which was entered sharply and the peritoneum  was also entered sharply without difficulty. The edges of the fascia were  then tagged with a #0 Vicryl suture on a UR6 and a Hasson trocar placed  through this incision. Pneumoperitoneum was created and the camera was  placed inside  the abdomen. There was blood noted inside the abdomen. Two 5  mm ports were placed in the lower quadrants under direct visualization  without difficulty and after injection with 1 mL of 0.25% Marcaine with  epinephrine. The right tube was then visualized, it was noted to be adherent  to the underlying broad ligament and this was brought down bluntly then the  right tube with a very large ectopic pregnancy and bleeding from the end was  removed using the tripolar without difficulty. The camera was then switched  to a size 5 camera and an Endobag was placed through the 10 mm port in the  umbilicus. The specimen was placed inside the Endobag and the specimen  removed easily. The camera was then switched back to the regular 10 mm  camera and the [place] was used to suction and irrigated the old blood from  the abdomen. There was approximately 750 mL of blood in the abdomen to 1  liter. The right adnexa was visualized and  found to be hemostatic. 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 Both 5 mm ports  were then removed under direct visualization and the 10 mm as well. The  previously mentioned #0 Vicryl sutures on the UR6 were then used and two  figure-of-eights to close the umbilical fascia. The skin was closed with 4-0  Vicryl suture running subcuticular fashion. The two 5 mm ports were closed  with 4-0 Vicryl in a running subcuticular fashion. All instrument and lap  counts were correct x2. The patient was awakened and taken to the recovery  room in stable condition.           ______________________________  Shelbie Proctor Shawnie Pons, M.D.     TSP/MEDQ  D:  08/24/2005  T:  08/25/2005  Job:  [id number]

## 2010-11-07 LAB — CBC
HCT: 39
MCHC: 35.1
MCV: 89.8
Platelets: 297
RBC: 4.34
WBC: 13.4 — ABNORMAL HIGH

## 2010-11-07 LAB — WET PREP, GENITAL: Trich, Wet Prep: NONE SEEN

## 2010-11-07 LAB — URINALYSIS, ROUTINE W REFLEX MICROSCOPIC
Specific Gravity, Urine: <1.005 — ABNORMAL LOW
pH: 5.5

## 2010-11-07 LAB — GC/CHLAMYDIA PROBE AMP, GENITAL
Chlamydia, DNA Probe: NEGATIVE
GC Probe Amp, Genital: NEGATIVE

## 2010-11-07 LAB — URINE MICROSCOPIC-ADD ON

## 2010-11-07 LAB — POCT PREGNANCY, URINE: Preg Test, Ur: NEGATIVE

## 2010-11-13 LAB — URINALYSIS, ROUTINE W REFLEX MICROSCOPIC
Glucose, UA: NEGATIVE
Protein, ur: NEGATIVE
Urobilinogen, UA: 0.2
pH: 6

## 2010-11-13 LAB — GC/CHLAMYDIA PROBE AMP, GENITAL
Chlamydia, DNA Probe: NEGATIVE
GC Probe Amp, Genital: NEGATIVE

## 2010-11-13 LAB — WET PREP, GENITAL
Clue Cells Wet Prep HPF POC: NONE SEEN
Trich, Wet Prep: NONE SEEN

## 2011-03-16 DIAGNOSIS — G5601 Carpal tunnel syndrome, right upper limb: Secondary | ICD-10-CM

## 2011-03-16 HISTORY — DX: Carpal tunnel syndrome, right upper limb: G56.01

## 2011-04-10 ENCOUNTER — Other Ambulatory Visit: Payer: Self-pay | Admitting: Orthopedic Surgery

## 2011-04-10 ENCOUNTER — Encounter (HOSPITAL_BASED_OUTPATIENT_CLINIC_OR_DEPARTMENT_OTHER)
Admission: RE | Admit: 2011-04-10 | Discharge: 2011-04-10 | Disposition: A | Discharge status: Home / Self Care | Payer: Medicaid Other | Source: Ambulatory Visit | Attending: Orthopedic Surgery | Admitting: Orthopedic Surgery

## 2011-04-10 ENCOUNTER — Encounter (HOSPITAL_BASED_OUTPATIENT_CLINIC_OR_DEPARTMENT_OTHER): Payer: Self-pay | Admitting: *Deleted

## 2011-04-10 LAB — COMPREHENSIVE METABOLIC PANEL WITH GFR
ALT: 12 U/L (ref 0–35)
AST: 18 U/L (ref 0–37)
Albumin: 3.9 g/dL (ref 3.5–5.2)
Alkaline Phosphatase: 84 U/L (ref 39–117)
BUN: 7 mg/dL (ref 6–23)
CO2: 30 meq/L (ref 19–32)
Calcium: 10.2 mg/dL (ref 8.4–10.5)
Chloride: 104 meq/L (ref 96–112)
Creatinine, Ser: 0.62 mg/dL (ref 0.50–1.10)
GFR calc Af Amer: >90 mL/min
GFR calc non Af Amer: >90 mL/min
Glucose, Bld: 103 mg/dL — ABNORMAL HIGH (ref 70–99)
Potassium: 4.6 meq/L (ref 3.5–5.1)
Sodium: 142 meq/L (ref 135–145)
Total Bilirubin: 0.3 mg/dL (ref 0.3–1.2)
Total Protein: 7.5 g/dL (ref 6.0–8.3)

## 2011-04-10 NOTE — Pre-Procedure Instructions (Signed)
To come for CMET 

## 2011-04-12 ENCOUNTER — Encounter (HOSPITAL_BASED_OUTPATIENT_CLINIC_OR_DEPARTMENT_OTHER): Payer: Self-pay | Admitting: Anesthesiology

## 2011-04-12 ENCOUNTER — Ambulatory Visit (HOSPITAL_BASED_OUTPATIENT_CLINIC_OR_DEPARTMENT_OTHER)
Admission: RE | Admit: 2011-04-12 | Discharge: 2011-04-12 | Disposition: A | Discharge status: Home Health | Payer: Medicaid Other | Source: Ambulatory Visit | Attending: Orthopedic Surgery | Admitting: Orthopedic Surgery

## 2011-04-12 ENCOUNTER — Encounter (HOSPITAL_BASED_OUTPATIENT_CLINIC_OR_DEPARTMENT_OTHER): Payer: Self-pay | Admitting: *Deleted

## 2011-04-12 ENCOUNTER — Encounter (HOSPITAL_BASED_OUTPATIENT_CLINIC_OR_DEPARTMENT_OTHER): Payer: Self-pay | Admitting: Orthopedic Surgery

## 2011-04-12 ENCOUNTER — Ambulatory Visit (HOSPITAL_BASED_OUTPATIENT_CLINIC_OR_DEPARTMENT_OTHER): Payer: Medicaid Other | Admitting: Anesthesiology

## 2011-04-12 ENCOUNTER — Encounter (HOSPITAL_BASED_OUTPATIENT_CLINIC_OR_DEPARTMENT_OTHER)
Admission: RE | Disposition: A | Discharge status: Home Health | Payer: Self-pay | Source: Ambulatory Visit | Attending: Orthopedic Surgery

## 2011-04-12 DIAGNOSIS — F17200 Nicotine dependence, unspecified, uncomplicated: Secondary | ICD-10-CM | POA: Insufficient documentation

## 2011-04-12 DIAGNOSIS — Z01812 Encounter for preprocedural laboratory examination: Secondary | ICD-10-CM | POA: Insufficient documentation

## 2011-04-12 DIAGNOSIS — G5600 Carpal tunnel syndrome, unspecified upper limb: Secondary | ICD-10-CM | POA: Insufficient documentation

## 2011-04-12 HISTORY — PX: CARPAL TUNNEL RELEASE: SHX101

## 2011-04-12 HISTORY — DX: Carpal tunnel syndrome, right upper limb: G56.01

## 2011-04-12 HISTORY — DX: Personal history of self-harm: Z91.5

## 2011-04-12 HISTORY — DX: Liver disease, unspecified: K76.9

## 2011-04-12 HISTORY — DX: Personal history of suicidal behavior: Z91.51

## 2011-04-12 LAB — POCT HEMOGLOBIN-HEMACUE: Hemoglobin: 14.4 g/dL (ref 12.0–15.0)

## 2011-04-12 SURGERY — CARPAL TUNNEL RELEASE
Anesthesia: General | Site: Hand | Laterality: Right | Wound class: Clean

## 2011-04-12 MED ORDER — MIDAZOLAM HCL 5 MG/5ML IJ SOLN
INTRAMUSCULAR | Status: DC | PRN
  Administered 2011-04-12: 1 mg via INTRAVENOUS

## 2011-04-12 MED ORDER — FENTANYL CITRATE 0.05 MG/ML IJ SOLN
25.0000 ug | INTRAMUSCULAR | Status: DC | PRN
  Administered 2011-04-12: 50 ug via INTRAVENOUS
  Administered 2011-04-12 (×2): 25 ug via INTRAVENOUS

## 2011-04-12 MED ORDER — PROPOFOL 10 MG/ML IV EMUL
INTRAVENOUS | Status: DC | PRN
  Administered 2011-04-12: 250 mg via INTRAVENOUS

## 2011-04-12 MED ORDER — OXYCODONE HCL 5 MG PO TABS: ORAL_TABLET | ORAL | Status: DC

## 2011-04-12 MED ORDER — CHLORHEXIDINE GLUCONATE 4 % EX LIQD: 60.0000 mL | Freq: Once | CUTANEOUS | Status: DC

## 2011-04-12 MED ORDER — BUPIVACAINE HCL (PF) 0.25 % IJ SOLN
INTRAMUSCULAR | Status: DC | PRN
  Administered 2011-04-12: 10 mL

## 2011-04-12 MED ORDER — FENTANYL CITRATE 0.05 MG/ML IJ SOLN
INTRAMUSCULAR | Status: DC | PRN
  Administered 2011-04-12 (×2): 50 ug via INTRAVENOUS

## 2011-04-12 MED ORDER — OXYCODONE HCL 5 MG PO TABS
10.0000 mg | ORAL_TABLET | ORAL | Status: DC | PRN
  Administered 2011-04-12: 10 mg via ORAL

## 2011-04-12 MED ORDER — LACTATED RINGERS IV SOLN
INTRAVENOUS | Status: DC
  Administered 2011-04-12 (×2): via INTRAVENOUS

## 2011-04-12 MED ORDER — METOCLOPRAMIDE HCL 5 MG/ML IJ SOLN
INTRAMUSCULAR | Status: DC | PRN
  Administered 2011-04-12: 10 mg via INTRAVENOUS

## 2011-04-12 MED ORDER — METOCLOPRAMIDE HCL 5 MG/ML IJ SOLN: 10.0000 mg | Freq: Once | INTRAMUSCULAR | Status: DC | PRN

## 2011-04-12 MED ORDER — DEXAMETHASONE SODIUM PHOSPHATE 10 MG/ML IJ SOLN
INTRAMUSCULAR | Status: DC | PRN
  Administered 2011-04-12: 4 mg via INTRAVENOUS

## 2011-04-12 MED ORDER — LIDOCAINE HCL (CARDIAC) 20 MG/ML IV SOLN
INTRAVENOUS | Status: DC | PRN
  Administered 2011-04-12: 75 mg via INTRAVENOUS

## 2011-04-12 MED ORDER — VANCOMYCIN HCL IN DEXTROSE 1-5 GM/200ML-% IV SOLN
1000.0000 mg | INTRAVENOUS | Status: AC
  Administered 2011-04-12: 1000 mg via INTRAVENOUS

## 2011-04-12 MED ORDER — MORPHINE SULFATE 10 MG/ML IJ SOLN: 0.0500 mg/kg | INTRAMUSCULAR | Status: DC | PRN

## 2011-04-12 SURGICAL SUPPLY — 39 items
BANDAGE ELASTIC 3 VELCRO ST LF (GAUZE/BANDAGES/DRESSINGS) ×2 IMPLANT
BANDAGE GAUZE ELAST BULKY 4 IN (GAUZE/BANDAGES/DRESSINGS) ×2 IMPLANT
BLADE MINI RND TIP GREEN BEAV (BLADE) IMPLANT
BLADE SURG 15 STRL LF DISP TIS (BLADE) ×2 IMPLANT
BLADE SURG 15 STRL SS (BLADE) ×4
BNDG CMPR 9X4 STRL LF SNTH (GAUZE/BANDAGES/DRESSINGS) ×1
BNDG ESMARK 4X9 LF (GAUZE/BANDAGES/DRESSINGS) ×1 IMPLANT
CHLORAPREP W/TINT 26ML (MISCELLANEOUS) ×2 IMPLANT
CLOTH BEACON ORANGE TIMEOUT ST (SAFETY) ×2 IMPLANT
CORDS BIPOLAR (ELECTRODE) ×2 IMPLANT
COVER MAYO STAND STRL (DRAPES) ×2 IMPLANT
COVER TABLE BACK 60X90 (DRAPES) ×2 IMPLANT
CUFF TOURNIQUET SINGLE 18IN (TOURNIQUET CUFF) ×2 IMPLANT
DRAPE EXTREMITY T 121X128X90 (DRAPE) ×2 IMPLANT
DRAPE SURG 17X23 STRL (DRAPES) ×2 IMPLANT
DRSG PAD ABDOMINAL 8X10 ST (GAUZE/BANDAGES/DRESSINGS) ×2 IMPLANT
GAUZE SPONGE 4X4 12PLY STRL LF (GAUZE/BANDAGES/DRESSINGS) ×1 IMPLANT
GAUZE XEROFORM 1X8 LF (GAUZE/BANDAGES/DRESSINGS) ×2 IMPLANT
GLOVE BIO SURGEON STRL SZ 6.5 (GLOVE) ×1 IMPLANT
GLOVE BIO SURGEON STRL SZ7.5 (GLOVE) ×2 IMPLANT
GLOVE INDICATOR 6.5 STRL GRN (GLOVE) ×1 IMPLANT
GLOVE SKINSENSE NS SZ7.0 (GLOVE) ×1
GLOVE SKINSENSE STRL SZ7.0 (GLOVE) IMPLANT
GOWN PREVENTION PLUS XLARGE (GOWN DISPOSABLE) ×3 IMPLANT
GOWN STRL REIN XL XLG (GOWN DISPOSABLE) ×2 IMPLANT
NDL HYPO 25X1 1.5 SAFETY (NEEDLE) IMPLANT
NEEDLE HYPO 25X1 1.5 SAFETY (NEEDLE) ×2 IMPLANT
NS IRRIG 1000ML POUR BTL (IV SOLUTION) ×2 IMPLANT
PACK BASIN DAY SURGERY FS (CUSTOM PROCEDURE TRAY) ×2 IMPLANT
PADDING CAST ABS 4INX4YD NS (CAST SUPPLIES) ×1
PADDING CAST ABS COTTON 4X4 ST (CAST SUPPLIES) ×1 IMPLANT
SPONGE GAUZE 4X4 12PLY (GAUZE/BANDAGES/DRESSINGS) ×2 IMPLANT
STOCKINETTE 4X48 STRL (DRAPES) ×2 IMPLANT
SUT ETHILON 4 0 PS 2 18 (SUTURE) ×2 IMPLANT
SYR BULB 3OZ (MISCELLANEOUS) ×2 IMPLANT
SYR CONTROL 10ML LL (SYRINGE) ×1 IMPLANT
TOWEL OR 17X24 6PK STRL BLUE (TOWEL DISPOSABLE) ×4 IMPLANT
UNDERPAD 30X30 INCONTINENT (UNDERPADS AND DIAPERS) ×2 IMPLANT
WATER STERILE IRR 1000ML POUR (IV SOLUTION) ×1 IMPLANT

## 2011-04-12 NOTE — H&P (Signed)
Karen Flowers is an 31 y.o. female.   Chief Complaint: carpal tunnel syndrome HPI: 31 yo rhd female with pins and needles sensation index and long finger bilateral hands x 2 years.  Nocturnal symptoms.  Tried splints without relief.  Positive nerve conduction studies.  Past Medical History  Diagnosis Date  . 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 Hx of suicide attempt     Past Surgical History  Procedure Date  . Diagnostic laparoscopy 03/13/2004    and EUA  . Salpingectomy 08/24/2005    right; laparoscopic    History reviewed. No pertinent family history. Social History:  reports that she has been smoking Cigarettes.  She has a 15 pack-year smoking history. She has never used smokeless tobacco. She reports that she drinks alcohol. She reports that she does not use illicit drugs.  Allergies:  Allergies  Allergen Reactions  . Acetaminophen Other (See Comments)    LIVER DAMAGE  . Penicillins Other (See Comments)    YEAST INFECTION    Medications Prior to Admission  Medication Dose Route Frequency Provider Last Rate Last Dose  . chlorhexidine (HIBICLENS) 4 % liquid 4 application  60 mL Topical Once Tami Ribas, MD      . lactated ringers infusion   Intravenous Continuous Constance Goltz, MD 10 mL/hr at 04/12/11 [phone]    . vancomycin (VANCOCIN) IVPB 1000 mg/200 mL premix  1,000 mg Intravenous 60 min Pre-Op Tami Ribas, MD   1,000 mg at 04/12/11 0736   No current outpatient prescriptions on file as of 04/12/2011.    Results for orders placed during the hospital encounter of 04/12/11 (from the past 48 hour(s))  COMPREHENSIVE METABOLIC PANEL     Status: Abnormal   Collection Time   04/10/11  1:30 PM      Component Value Range Comment   Sodium 142  135 - 145 (mEq/L)    Potassium 4.6  3.5 - 5.1 (mEq/L)    Chloride 104  96 - 112 (mEq/L)    CO2 30  19 - 32 (mEq/L)    Glucose, Bld 103 (*) 70 - 99 (mg/dL)      BUN 7  6 - 23 (mg/dL)    Creatinine, Ser 9.60  0.50 - 1.10 (mg/dL)    Calcium 45.4  8.4 - 10.5 (mg/dL)    Total Protein 7.5  6.0 - 8.3 (g/dL)    Albumin 3.9  3.5 - 5.2 (g/dL)    AST 18  0 - 37 (U/L)    ALT 12  0 - 35 (U/L)    Alkaline Phosphatase 84  39 - 117 (U/L)    Total Bilirubin 0.3  0.3 - 1.2 (mg/dL)    GFR calc non Af Amer >90  >90 (mL/min)    GFR calc Af Amer >90  >90 (mL/min)   POCT HEMOGLOBIN-HEMACUE     Status: Normal   Collection Time   04/12/11  7:28 AM      Component Value Range Comment   Hemoglobin 14.4  12.0 - 15.0 (g/dL)     No results found.   A comprehensive review of systems was negative except for: Neurological: positive for headaches Behavioral/Psych: positive for anxiety, depression and sleep disturbance  Blood pressure 109/75, pulse 83, temperature 97.8 F (36.6 C), temperature source Oral, resp. rate 18, height 5\' 1"  (1.549 m), weight 78.019 kg (172 lb), last menstrual period 03/29/2011,  SpO2 98.00%.  General appearance: alert, cooperative and appears stated age Head: Normocephalic, without obvious abnormality, atraumatic Neck: supple, symmetrical, trachea midline Resp: clear to auscultation bilaterally Cardio: regular rate and rhythm GI: soft, non-tender; bowel sounds normal; no masses,  no organomegaly Extremities: light touch sensation and capillary refill intact all digits.  +epl/fpl/io. positive tinels, phalens, durkins at carpal tunnel Pulses: 2+ and symmetric Skin: Skin color, texture, turgor normal. No rashes or lesions Neurologic: Grossly normal Incision/Wound: na  Assessment/Plan Bilateral carpal tunnel syndrome right greater than left.  Discussed nature of condition with patient and non operative and operative treatment measures.  She elected operative release of right carpal tunnel.  Risks, benefits, and alternatives of surgery were discussed and the patient agrees with the plan of care.   Karen Flowers R 04/12/2011, 8:46 AM

## 2011-04-12 NOTE — Anesthesia Preprocedure Evaluation (Signed)
Anesthesia Evaluation  Patient identified by MRN, date of birth, ID band Patient awake    Reviewed: Allergy & Precautions, H&P , NPO status , Patient's Chart, lab work & pertinent test results, reviewed documented beta blocker date and time   Airway Mallampati: II TM Distance: >3 FB Neck ROM: full    Dental   Pulmonary Current Smoker,          Cardiovascular neg cardio ROS     Neuro/Psych PSYCHIATRIC DISORDERS Negative Neurological ROS     GI/Hepatic negative GI ROS, Neg liver ROS,   Endo/Other  Negative Endocrine ROS  Renal/GU negative Renal ROS  Genitourinary negative   Musculoskeletal   Abdominal   Peds  Hematology negative hematology ROS (+)   Anesthesia Other Findings See surgeon's H&P   Reproductive/Obstetrics negative OB ROS                           Anesthesia Physical Anesthesia Plan  ASA: II  Anesthesia Plan: General   Post-op Pain Management:    Induction: Intravenous  Airway Management Planned: LMA  Additional Equipment:   Intra-op Plan:   Post-operative Plan: Extubation in OR  Informed Consent: I have reviewed the patients History and Physical, chart, labs and discussed the procedure including the risks, benefits and alternatives for the proposed anesthesia with the patient or authorized representative who has indicated his/her understanding and acceptance.     Plan Discussed with: CRNA and Surgeon  Anesthesia Plan Comments:         Anesthesia Quick Evaluation

## 2011-04-12 NOTE — Transfer of Care (Signed)
Immediate Anesthesia Transfer of Care Note  Patient: Karen Flowers  Procedure(s) Performed: Procedure(s) (LRB): CARPAL TUNNEL RELEASE (Right)  Patient Location: PACU  Anesthesia Type: General  Level of Consciousness: awake and alert   Airway & Oxygen Therapy: Patient Spontanous Breathing and Patient connected to face mask oxygen  Post-op Assessment: Report given to PACU RN and Post -op Vital signs reviewed and stable  Post vital signs: Reviewed and stable  Complications: No apparent anesthesia complications

## 2011-04-12 NOTE — Op Note (Signed)
Dictation [phone]

## 2011-04-12 NOTE — Anesthesia Postprocedure Evaluation (Signed)
Anesthesia Post Note  Patient: Karen Flowers  Procedure(s) Performed: Procedure(s) (LRB): CARPAL TUNNEL RELEASE (Right)  Anesthesia type: General  Patient location: PACU  Post pain: Pain level controlled  Post assessment: Patient's Cardiovascular Status Stable  Last Vitals:  Filed Vitals:   04/12/11 1030  BP: 126/83  Pulse: 77  Temp:   Resp: 22    Post vital signs: Reviewed and stable  Level of consciousness: alert  Complications: No apparent anesthesia complications

## 2011-04-12 NOTE — Discharge Instructions (Signed)

## 2011-04-12 NOTE — Anesthesia Procedure Notes (Signed)
Procedure Name: LMA Insertion Date/Time: 04/12/2011 9:05 AM Performed by: Zenia Resides D Pre-anesthesia Checklist: Patient identified, Emergency Drugs available, Suction available, Patient being monitored and Timeout performed Patient Re-evaluated:Patient Re-evaluated prior to inductionOxygen Delivery Method: Circle System Utilized Preoxygenation: Pre-oxygenation with 100% oxygen Intubation Type: IV induction Ventilation: Mask ventilation without difficulty LMA: LMA inserted LMA Size: 4.0 Number of attempts: 1 Airway Equipment and Method: bite block Placement Confirmation: positive ETCO2 and breath sounds checked- equal and bilateral Tube secured with: Tape Dental Injury: Teeth and Oropharynx as per pre-operative assessment

## 2011-04-12 NOTE — Brief Op Note (Signed)
04/12/2011  9:41 AM  PATIENT:  Karen Flowers  31 y.o. female  PRE-OPERATIVE DIAGNOSIS:  right cts  POST-OPERATIVE DIAGNOSIS:  Right Caarpal Tunnel Syndrone  PROCEDURE:  Procedure(s) (LRB): CARPAL TUNNEL RELEASE (Right)  SURGEON:  Surgeon(s) and Role:    * Tami Ribas, MD - Primary  PHYSICIAN ASSISTANT:   ASSISTANTS: none   ANESTHESIA:   general  EBL:     BLOOD ADMINISTERED:none  DRAINS: none   LOCAL MEDICATIONS USED:  MARCAINE     SPECIMEN:  No Specimen  DISPOSITION OF SPECIMEN:  N/A  COUNTS:  YES  TOURNIQUET:   Total Tourniquet Time Documented: Upper Arm (Right) - 23 minutes  DICTATION: .Other Dictation: Dictation Number [id number]  PLAN OF CARE: Discharge to home after PACU  PATIENT DISPOSITION:  PACU - hemodynamically stable.

## 2011-04-13 ENCOUNTER — Encounter (HOSPITAL_BASED_OUTPATIENT_CLINIC_OR_DEPARTMENT_OTHER): Payer: Self-pay | Admitting: Orthopedic Surgery

## 2011-04-13 NOTE — Op Note (Signed)
Karen Flowers, [place] NO.:  [id number]  MEDICAL RECORD NO.:  [id number]  LOCATION:                                 FACILITY:  PHYSICIAN:  Betha Loa, MD             DATE OF BIRTH:  DATE OF PROCEDURE:  04/12/2011 DATE OF DISCHARGE:                              OPERATIVE REPORT   PREOPERATIVE DIAGNOSIS:  Right carpal tunnel syndrome.  POSTOPERATIVE DIAGNOSIS:  Right carpal tunnel syndrome.  PROCEDURE:  Right carpal tunnel release.  SURGEON:  Betha Loa, MD  ASSISTANTS:  None.  ANESTHESIA:  General.  IV FLUIDS:  Per anesthesia flow sheet.  ESTIMATED BLOOD LOSS:  Minimal.  COMPLICATIONS:  None.  SPECIMENS:  None.  TOURNIQUET TIME:  23 minutes.  DISPOSITION:  Stable to PACU.  INDICATIONS:  Ms. Shrestha is a 31 year old female who was been seen in my office for complaints of pins and needle sensation in the index and long finger.  She had nocturnal symptoms that wake her at night causing her to shake out her hand.  She had a positive Tinel's, Phalen's, and median nerve at the carpal tunnel.  Nerve conduction studies were performed confirming bilateral carpal tunnel syndrome.  We discussed the nature of carpal tunnel syndrome and nonoperative and operative treatment options. She elected operative carpal tunnel release.  Risks, benefits, and alternatives were discussed including the risk of blood loss, infection, damage to nerves, vessels, tendons, ligaments, bone, failure of surgery, need for additional surgery, complications with wound healing, continued pain, continued carpal tunnel syndrome.  She voiced understanding of these risks and elected to proceed.  OPERATIVE COURSE:  After being identified preoperatively by myself, the patient I agreed upon the procedure and site of the procedure.  The surgical site was marked.  The risks, benefits, and alternatives of surgery were reviewed, and she wished to proceed.  Surgical consent had been signed.   She was given 1 g of IV vancomycin as preoperative antibiotic prophylaxis due to penicillin allergy.  She was transported to the operating room and placed on the operating table in the supine position with the right upper extremity on arm board.  General anesthesia was induced by the anesthesiologist.  The right upper extremity was prepped and draped in normal sterile orthopedic fashion. Surgical pause was performed between surgeons, anesthesia, operating staff, and all were in agreement with the patient, procedure, and site of procedure.  Tourniquet on the proximal aspect of the extremity was inflated to 250 mmHg after exsanguination of the limb with an Esmarch bandage.  Incision was made over the transverse carpal ligament.  This was carried into subcutaneous tissues by spreading technique.  The transverse carpal ligament was incised.  Care was taken to ensure it was incised to its fullest extent distally.  It was then incised proximally.  The distal aspect of the volar antebrachial fascia was released using the scissors.  A finger was placed in the wound to ensure adequate decompression which was the case.  There were a few bands of tighter tissue proximally that were also released.  Care was taken to ensure complete decompression of the median  nerve at the carpal tunnel.  The motor nerve was identified and was intact.  The wound was copiously irrigated with sterile saline.  It was closed with 4-0 nylon in a horizontal mattress fashion.  It was injected with 10 mL of 0.25% plain Marcaine to aid in postoperative analgesia.  The wound was then dressed with sterile Xeroform, 4x4s, and ABD and wrapped with Kerlix and Ace bandage.  The fingertips were pink with brisk capillary refill after deflation of the tourniquet at 23 minutes.  The operative drapes were broken down, and the patient was awoken from anesthesia safely.  She was transferred back to the stretcher and taken to PACU in stable  condition. I will see her in the office in 1 week for postoperative followup.  I will give her oxycodone 5 mg 1-2 p.o. q.6 hours p.r.n. pain, dispensed #40.     Betha Loa, MD     KK/MEDQ  D:  04/12/2011  T:  04/12/2011  Job:  [id number]

## 2013-06-11 ENCOUNTER — Encounter (HOSPITAL_COMMUNITY): Payer: Self-pay | Admitting: *Deleted

## 2013-06-11 ENCOUNTER — Inpatient Hospital Stay (HOSPITAL_COMMUNITY): Payer: Self-pay

## 2013-06-11 ENCOUNTER — Inpatient Hospital Stay (HOSPITAL_COMMUNITY)
Admission: AD | Admit: 2013-06-11 | Discharge: 2013-06-11 | Disposition: A | Discharge status: Home / Self Care | Payer: Medicaid Other | Source: Ambulatory Visit | Attending: Obstetrics & Gynecology | Admitting: Obstetrics & Gynecology

## 2013-06-11 DIAGNOSIS — K529 Noninfective gastroenteritis and colitis, unspecified: Secondary | ICD-10-CM

## 2013-06-11 DIAGNOSIS — K5730 Diverticulosis of large intestine without perforation or abscess without bleeding: Secondary | ICD-10-CM | POA: Insufficient documentation

## 2013-06-11 DIAGNOSIS — R109 Unspecified abdominal pain: Secondary | ICD-10-CM | POA: Insufficient documentation

## 2013-06-11 DIAGNOSIS — K5289 Other specified noninfective gastroenteritis and colitis: Secondary | ICD-10-CM

## 2013-06-11 DIAGNOSIS — G5600 Carpal tunnel syndrome, unspecified upper limb: Secondary | ICD-10-CM | POA: Insufficient documentation

## 2013-06-11 DIAGNOSIS — F17200 Nicotine dependence, unspecified, uncomplicated: Secondary | ICD-10-CM | POA: Insufficient documentation

## 2013-06-11 DIAGNOSIS — N809 Endometriosis, unspecified: Secondary | ICD-10-CM | POA: Insufficient documentation

## 2013-06-11 HISTORY — DX: Diverticulosis of intestine, part unspecified, without perforation or abscess without bleeding: K57.90

## 2013-06-11 HISTORY — DX: Endometriosis, unspecified: N80.9

## 2013-06-11 LAB — URINALYSIS, ROUTINE W REFLEX MICROSCOPIC
Bilirubin Urine: NEGATIVE
GLUCOSE, UA: NEGATIVE mg/dL
Ketones, ur: NEGATIVE mg/dL
LEUKOCYTES UA: NEGATIVE
Nitrite: NEGATIVE
PROTEIN: NEGATIVE mg/dL
SPECIFIC GRAVITY, URINE: 1.015 (ref 1.005–1.030)
UROBILINOGEN UA: 0.2 mg/dL (ref 0.0–1.0)
pH: 6 (ref 5.0–8.0)

## 2013-06-11 LAB — WET PREP, GENITAL
Clue Cells Wet Prep HPF POC: NONE SEEN
Trich, Wet Prep: NONE SEEN
Yeast Wet Prep HPF POC: NONE SEEN

## 2013-06-11 LAB — URINE MICROSCOPIC-ADD ON

## 2013-06-11 LAB — POCT PREGNANCY, URINE: PREG TEST UR: NEGATIVE

## 2013-06-11 NOTE — MAU Provider Note (Signed)
Attestation of Attending Supervision of Advanced Practitioner (CNM/NP): Evaluation and management procedures were performed by the Advanced Practitioner under my supervision and collaboration. I have reviewed the Advanced Practitioner's note and chart, and I agree with the management and plan.  Lesly DukesKelly H Jvion Turgeon 6:36 PM

## 2013-06-11 NOTE — MAU Note (Signed)
Patient states has been having left side pain for 3-4 days. Has nausea, no vomiting, diarrhea off and on. Denies bleeding or discharge.

## 2013-06-11 NOTE — Discharge Instructions (Signed)
Diarrhea Diarrhea is frequent loose and watery bowel movements. It can cause you to feel weak and dehydrated. Dehydration can cause you to become tired and thirsty, have a dry mouth, and have decreased urination that often is dark yellow. Diarrhea is a sign of another problem, most often an infection that will not last long. In most cases, diarrhea typically lasts 2 3 days. However, it can last longer if it is a sign of something more serious. It is important to treat your diarrhea as directed by your caregive to lessen or prevent future episodes of diarrhea. CAUSES  Some common causes include:  Gastrointestinal infections caused by viruses, bacteria, or parasites.  Food poisoning or food allergies.  Certain medicines, such as antibiotics, chemotherapy, and laxatives.  Artificial sweeteners and fructose.  Digestive disorders. HOME CARE INSTRUCTIONS  Ensure adequate fluid intake (hydration): have 1 cup (8 oz) of fluid for each diarrhea episode. Avoid fluids that contain simple sugars or sports drinks, fruit juices, whole milk products, and sodas. Your urine should be clear or pale yellow if you are drinking enough fluids. Hydrate with an oral rehydration solution that you can purchase at pharmacies, retail stores, and online. You can prepare an oral rehydration solution at home by mixing the following ingredients together:    tsp table salt.   tsp baking soda.   tsp salt substitute containing potassium chloride.  1  tablespoons sugar.  1 L (34 oz) of water.  Certain foods and beverages may increase the speed at which food moves through the gastrointestinal (GI) tract. These foods and beverages should be avoided and include:  Caffeinated and alcoholic beverages.  High-fiber foods, such as raw fruits and vegetables, nuts, seeds, and whole grain breads and cereals.  Foods and beverages sweetened with sugar alcohols, such as xylitol, sorbitol, and mannitol.  Some foods may be well  tolerated and may help thicken stool including:  Starchy foods, such as rice, toast, pasta, low-sugar cereal, oatmeal, grits, baked potatoes, crackers, and bagels.  Bananas.  Applesauce.  Add probiotic-rich foods to help increase healthy bacteria in the GI tract, such as yogurt and fermented milk products.  Wash your hands well after each diarrhea episode.  Only take over-the-counter or prescription medicines as directed by your caregiver.  Take a warm bath to relieve any burning or pain from frequent diarrhea episodes. SEEK IMMEDIATE MEDICAL CARE IF:   You are unable to keep fluids down.  You have persistent vomiting.  You have blood in your stool, or your stools are black and tarry.  You do not urinate in 6 8 hours, or there is only a small amount of very dark urine.  You have abdominal pain that increases or localizes.  You have weakness, dizziness, confusion, or lightheadedness.  You have a severe headache.  Your diarrhea gets worse or does not get better.  You have a fever or persistent symptoms for more than 2 3 days.  You have a fever and your symptoms suddenly get worse. MAKE SURE YOU:   Understand these instructions.  Will watch your condition.  Will get help right away if you are not doing well or get worse. Document Released: 01/19/2002 Document Revised: 01/16/2012 Document Reviewed: 10/07/2011 [employer] Patient Information 2014 [employer].  

## 2013-06-11 NOTE — MAU Provider Note (Signed)
CC: Abdominal Pain    First Provider Initiated Contact with Patient 06/11/13 1722      HPI Karen Flowers is a 33 y.o. [id number] who presents with onset of left suprapubic cramping pain for 3 days. She describes the pain as continuous but waxing and waning. Not related to activity or bowel movements. No previous episodes. LMP 05/30/2013 duration one week. No contraception because she has difficulty becoming pregnant. She also reports loose stools x3 yesterday and once today, though she is usually constipated. No blood in stool. Had  nausea yesterday and this AM; no vomiting. No change in diet or other family members sick. Has not had problem with diverticulosis for years. Took Tylenol once this am for headache.  Denies dysuria, hematuria, frequency or urgency of urination.   Past Medical History  Diagnosis Date  . 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 Hx of suicide attempt   . Endometriosis   . Diverticulosis     OB History  Gravida Para Term Preterm AB SAB TAB Ectopic Multiple Living  4 1 1  3 1  2  1     # Outcome Date GA Lbr Len/2nd Weight Sex Delivery Anes PTL Lv  4 SAB           3 ECT           2 ECT           1 TRM         Y      Past Surgical History  Procedure Laterality Date  . Diagnostic laparoscopy  03/13/2004    and EUA  . Salpingectomy  08/24/2005    right; laparoscopic  . Carpal tunnel release  04/12/2011    Procedure: CARPAL TUNNEL RELEASE;  Surgeon: Tami RibasKevin R Kuzma, MD;  [facility]  [facility];  Service: Orthopedics;  Laterality: Right;    History   Social History  . Marital Status: Single    Spouse Name: N/A    Number of Children: N/A  . Years of Education: N/A   Occupational History  . Not on file.   Social History Main Topics  . Smoking status: Current Every Day Smoker -- 1.00 packs/day for 15 years    Types: Cigarettes  . Smokeless tobacco: Never Used  . Alcohol Use: Yes      Comment: occasionally  . Drug Use: No  . Sexual Activity: Not on file   Other Topics Concern  . Not on file   Social History Narrative  . No narrative on file    No current facility-administered medications on file prior to encounter.   Current Outpatient Prescriptions on File Prior to Encounter  Medication Sig Dispense Refill  . ibuprofen (ADVIL,MOTRIN) 200 MG tablet Take 800 mg by mouth every 6 (six) hours as needed (For pain.).         Allergies  Allergen Reactions  . Acetaminophen Other (See Comments)    LIVER DAMAGE  . Latex Itching  . Penicillins Other (See Comments)    YEAST INFECTION    ROS Pertinent items in HPI  PHYSICAL EXAM Filed Vitals:   06/11/13 1636  BP: 127/84  Pulse: 96  Temp: 99.1 F (37.3 C)  Resp: 16   General: Well nourished, well developed female in no acute distress Cardiovascular: Normal rate Respiratory: Normal effort Abdomen: Soft, nontender throughout Back: No CVAT Extremities: No edema Neurologic: Alert and oriented  Speculum exam: NEFG; vagina with physiologic discharge, no blood; cervix clean Bimanual exam: cervix closed, no CMT; uterus and left adnexa slightly TTP; no adnexal masses   LAB RESULTS Results for orders placed during the hospital encounter of 06/11/13 (from the past 24 hour(s))  URINALYSIS, ROUTINE W REFLEX MICROSCOPIC     Status: Abnormal   Collection Time    06/11/13  4:40 PM      Result Value Ref Range   Color, Urine YELLOW  YELLOW   APPearance CLEAR  CLEAR   Specific Gravity, Urine 1.015  1.005 - 1.030   pH 6.0  5.0 - 8.0   Glucose, UA NEGATIVE  NEGATIVE mg/dL   Hgb urine dipstick MODERATE (*) NEGATIVE   Bilirubin Urine NEGATIVE  NEGATIVE   Ketones, ur NEGATIVE  NEGATIVE mg/dL   Protein, ur NEGATIVE  NEGATIVE mg/dL   Urobilinogen, UA 0.2  0.0 - 1.0 mg/dL   Nitrite NEGATIVE  NEGATIVE   Leukocytes, UA NEGATIVE  NEGATIVE  URINE MICROSCOPIC-ADD ON     Status: Abnormal   Collection Time    06/11/13   4:40 PM      Result Value Ref Range   Squamous Epithelial / LPF RARE  RARE   WBC, UA 0-2  <3 WBC/hpf   RBC / HPF 11-20  <3 RBC/hpf   Bacteria, UA FEW (*) RARE  POCT PREGNANCY, URINE     Status: None   Collection Time    06/11/13  4:57 PM      Result Value Ref Range   Preg Test, Ur NEGATIVE  NEGATIVE  WET PREP, GENITAL     Status: Abnormal   Collection Time    06/11/13  5:42 PM      Result Value Ref Range   Yeast Wet Prep HPF POC NONE SEEN  NONE SEEN   Trich, Wet Prep NONE SEEN  NONE SEEN   Clue Cells Wet Prep HPF POC NONE SEEN  NONE SEEN   WBC, Wet Prep HPF POC FEW (*) NONE SEEN    IMAGING No results found.  MAU COURSE GC/CT sent Discussed obtaining US if sx's persist  ASSESSMENT  1. Gastroenteritis     PLAN Discharge home. See AVS for patient education.    Medication List         ibuprofen 200 MG tablet  Commonly known as:  ADVIL,MOTRIN  Take 800 mg by mouth every 6 (six) hours as needed (For pain.).       Follow-up Information   [facility]. (As needed, If symptoms worsen)    Specialty:  Family Medicine   Contact information:   [address] [place] [id number] [phone]        Danae Orleanseirdre C Alazne Quant, CNM 06/11/2013 5:24 PM

## 2013-06-12 LAB — GC/CHLAMYDIA PROBE AMP
CT Probe RNA: NEGATIVE
GC Probe RNA: NEGATIVE

## 2013-12-14 ENCOUNTER — Encounter (HOSPITAL_COMMUNITY): Payer: Self-pay | Admitting: *Deleted

## 2014-05-21 ENCOUNTER — Emergency Department (HOSPITAL_COMMUNITY)
Admission: EM | Admit: 2014-05-21 | Discharge: 2014-05-21 | Disposition: A | Discharge status: Home / Self Care | Payer: BLUE CROSS/BLUE SHIELD | Source: Home / Self Care | Attending: Family Medicine | Admitting: Family Medicine

## 2014-05-21 ENCOUNTER — Encounter (HOSPITAL_COMMUNITY): Payer: Self-pay

## 2014-05-21 DIAGNOSIS — N390 Urinary tract infection, site not specified: Secondary | ICD-10-CM

## 2014-05-21 LAB — POCT URINALYSIS DIP (DEVICE)
BILIRUBIN URINE: NEGATIVE
Glucose, UA: NEGATIVE mg/dL
KETONES UR: NEGATIVE mg/dL
Leukocytes, UA: NEGATIVE
Nitrite: NEGATIVE
Protein, ur: NEGATIVE mg/dL
SPECIFIC GRAVITY, URINE: 1.015 (ref 1.005–1.030)
Urobilinogen, UA: 0.2 mg/dL (ref 0.0–1.0)
pH: 7 (ref 5.0–8.0)

## 2014-05-21 LAB — POCT PREGNANCY, URINE: PREG TEST UR: NEGATIVE

## 2014-05-21 MED ORDER — CIPROFLOXACIN HCL 500 MG PO TABS: 500.0000 mg | ORAL_TABLET | Freq: Two times a day (BID) | ORAL | Status: DC

## 2014-05-21 NOTE — ED Notes (Signed)
C/o pain lower abdominal area; incontinent of UA at times today; NAD. Eating chips

## 2014-05-21 NOTE — Discharge Instructions (Signed)
Take all of medicine as directed, drink lots of fluids, see your doctor if further problems. °

## 2014-05-21 NOTE — ED Provider Notes (Signed)
CSN: [id number]     Arrival date & time 05/21/14  1906 History   First MD Initiated Contact with Patient 05/21/14 1958     Chief Complaint  Patient presents with  . Abdominal Pain   (Consider location/radiation/quality/duration/timing/severity/associated sxs/prior Treatment) Patient is a 34 y.o. female presenting with dysuria. The history is provided by the patient.  Dysuria Pain quality:  Aching Pain severity:  Mild Onset quality:  Gradual Progression:  Worsening Chronicity:  New Recent urinary tract infections: no   Urinary symptoms: incontinence   Associated symptoms: abdominal pain   Associated symptoms: no fever, no flank pain, no nausea, no vaginal discharge and no vomiting   Risk factors: hx of urolithiasis   Risk factors: not pregnant and no recurrent urinary tract infections     Past Medical History  Diagnosis Date  . 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 Hx of suicide attempt   . Endometriosis   . Diverticulosis    Past Surgical History  Procedure Laterality Date  . Diagnostic laparoscopy  03/13/2004    and EUA  . Salpingectomy  08/24/2005    right; laparoscopic  . Carpal tunnel release  04/12/2011    Procedure: CARPAL TUNNEL RELEASE;  Surgeon: Tami Ribas, MD;  Location: [facility];  Service: Orthopedics;  Laterality: Right;   No family history on file. History  Substance Use Topics  . Smoking status: Current Every Day Smoker -- 1.00 packs/day for 15 years    Types: Cigarettes  . Smokeless tobacco: Never Used  . Alcohol Use: Yes     Comment: occasionally   OB History    Gravida Para Term Preterm AB TAB SAB Ectopic Multiple Living   Review of Systems  Constitutional: Negative.  Negative for fever.  Gastrointestinal: Positive for abdominal pain. Negative for nausea, vomiting and diarrhea.  Genitourinary: Positive for dysuria and pelvic pain. Negative  for urgency, frequency, hematuria, flank pain, vaginal bleeding, vaginal discharge, vaginal pain and menstrual problem.  Musculoskeletal: Positive for back pain.    Allergies  Acetaminophen; Latex; and Penicillins  Home Medications   Prior to Admission medications   Medication Sig Start Date End Date Taking? Authorizing Provider  ciprofloxacin (CIPRO) 500 MG tablet Take 1 tablet (500 mg total) by mouth every 12 (twelve) hours. 05/21/14   Linna Hoff, MD  ibuprofen (ADVIL,MOTRIN) 200 MG tablet Take 800 mg by mouth every 6 (six) hours as needed (For pain.).     Historical Provider, MD   BP 136/92 mmHg  Pulse 96  Temp(Src) 98.5 F (36.9 C) (Oral)  Resp 18  SpO2 99%  LMP 05/05/2014 Physical Exam  Constitutional: She is oriented to person, place, and time.  Abdominal: Soft. Bowel sounds are normal. She exhibits no distension and no mass. There is tenderness in the suprapubic area. There is no rebound, no guarding and no CVA tenderness.    Neurological: She is alert and oriented to person, place, and time.    ED Course  Procedures (including critical care time) Labs Review Labs Reviewed  POCT URINALYSIS DIP (DEVICE) - Abnormal; Notable for the following:    Hgb urine dipstick MODERATE (*)    All other components within normal limits  POCT PREGNANCY, URINE    Imaging Review No results found.   MDM   1. UTI (lower  urinary tract infection)        Linna HoffJames D Waymond Meador, MD 05/21/14 2019

## 2020-02-10 DIAGNOSIS — F331 Major depressive disorder, recurrent, moderate: Secondary | ICD-10-CM | POA: Insufficient documentation

## 2020-02-10 HISTORY — DX: Major depressive disorder, recurrent, moderate: F33.1

## 2020-03-14 DIAGNOSIS — F900 Attention-deficit hyperactivity disorder, predominantly inattentive type: Secondary | ICD-10-CM | POA: Insufficient documentation

## 2020-03-14 HISTORY — DX: Attention-deficit hyperactivity disorder, predominantly inattentive type: F90.0

## 2023-03-19 ENCOUNTER — Encounter: Payer: Self-pay | Admitting: Family

## 2023-03-19 ENCOUNTER — Telehealth: Payer: Self-pay | Admitting: Family

## 2023-03-19 ENCOUNTER — Ambulatory Visit: Payer: No Typology Code available for payment source | Admitting: Family

## 2023-03-19 VITALS — BP 121/79 | HR 85 | Temp 97.6°F | Ht 61.0 in | Wt 197.6 lb

## 2023-03-19 DIAGNOSIS — E66812 Obesity, class 2: Secondary | ICD-10-CM | POA: Diagnosis not present

## 2023-03-19 DIAGNOSIS — Z6837 Body mass index (BMI) 37.0-37.9, adult: Secondary | ICD-10-CM

## 2023-03-19 DIAGNOSIS — F419 Anxiety disorder, unspecified: Secondary | ICD-10-CM | POA: Diagnosis not present

## 2023-03-19 DIAGNOSIS — F32A Depression, unspecified: Secondary | ICD-10-CM

## 2023-03-19 DIAGNOSIS — F908 Attention-deficit hyperactivity disorder, other type: Secondary | ICD-10-CM | POA: Insufficient documentation

## 2023-03-19 DIAGNOSIS — E6609 Other obesity due to excess calories: Secondary | ICD-10-CM

## 2023-03-19 MED ORDER — SERTRALINE HCL 25 MG PO TABS: 25.0000 mg | ORAL_TABLET | Freq: Every day | ORAL | 2 refills | Status: DC

## 2023-03-19 MED ORDER — LISDEXAMFETAMINE DIMESYLATE 30 MG PO CAPS: 30.0000 mg | ORAL_CAPSULE | Freq: Every day | ORAL | 0 refills | Status: DC

## 2023-03-19 MED ORDER — HYDROXYZINE HCL 25 MG PO TABS: 25.0000 mg | ORAL_TABLET | Freq: Two times a day (BID) | ORAL | 2 refills | Status: AC | PRN

## 2023-03-19 NOTE — Telephone Encounter (Signed)
Using the good RX card, generic XR Adderall is $29 for 30 pills at [facility], at [facility] it is $100. She needs to give her insurance info first, but then [facility] can be an option. Let me know, thx

## 2023-03-19 NOTE — Assessment & Plan Note (Signed)
 History of ADHD, previously managed with Vyvanse . Patient reports benefit from medication and desire to restart. -Start Vyvanse  30mg  daily. -PDMP checked and verified. -Sign controlled substance agreement and educate on responsible use. -F/U in 1 month, ok if virtual

## 2023-03-19 NOTE — Telephone Encounter (Signed)
Copied from CRM [id number]. Topic: Clinical - Medication Question >> Mar 19, 2023  2:06 PM Karen Flowers wrote: Reason for CRM: Patient is calling in regarding having a cheaper alternative for lisdexamfetamine (VYVANSE) 30 MG capsule sent to [facility] pharmacy

## 2023-03-19 NOTE — Assessment & Plan Note (Signed)
Wt. Loss strategies reviewed including portion control, less carbs including sweets, eating most of calories earlier in day, drinking 64oz water qd, and establishing daily exercise routine. °

## 2023-03-19 NOTE — Progress Notes (Signed)
 New Patient Office Visit  Subjective:  Patient ID: Karen Flowers, female    DOB: 06-09-1980  Age: 43 y.o. MRN: [id number]  CC:  Chief Complaint  Patient presents with   New Patient (Initial Visit)   HPI Karen Flowers presents for establishing care today.  Discussed the use of AI scribe software for clinical note transcription with the patient, who gave verbal consent to proceed.  History of Present Illness   Karen Flowers is a 43 year old female with depression, anxiety, and ADHD who presents for medication management. She is accompanied by her sister. After spending 2023 in [place], she returned in early 2024 and has since experienced significant depression and anxiety. She describes her depression as 'very, very, very real' and expresses a desire to 'be shut up in the dark.' She has been waiting to restart her medications due to insurance issues, which have recently been resolved. She recalls being on several medications in the past, including trazodone, Prozac, Zoloft , and Vyvanse . Trazodone was used for sleep at a high dose of 200 mg. Vyvanse  was taken for ADHD, initially at a higher dose but later adjusted to 30 mg in the morning and 10 mg in the evening due to feeling 'hyped up' and then crashing. She also took hydroxyzine  for anxiety, taking up to two tablets at a time twice a day. She has a history of being diagnosed with ADHD from a young age, with symptoms persisting into adulthood. She describes a history of being in mental health facilities and group homes as a child and has been on medications for these conditions for most of her life.  She is currently unemployed and living with her sister. She has a 33 year old daughter who was put up for adoption at birth. She spent 2023 in [place] with her partner but returned due to financial constraints and to help her sister. She wants to return to [place] once she has saved enough money.     Assessment & Plan:     Depression and Anxiety  - Significant symptoms of depression and anxiety reported after discontinuation of previous medications. Patient has a history of being on multiple medications including Trazodone, Sertraline , and Hydroxyzine . -Start Hydroxyzine  25mg  bid prn, ok to cut pill in half if causes drowsiness. -Start Sertraline  25mg  daily. -Consider therapy referral, although patient has had negative experiences in the past. -F/U in 1 month  ADHD - History of ADHD, previously managed with Vyvanse . Patient reports benefit from medication and desire to restart. -Start Vyvanse  30mg  daily. -PDMP checked and verified. -Sign controlled substance agreement and educate on responsible use. -F/U in 1 month, ok if virtual   Obesity -  Wt. Loss strategies reviewed including portion control, less carbs including sweets, eating most of calories earlier in day, drinking 64oz water qd, and establishing daily exercise routine.      Subjective:    Outpatient Medications Prior to Visit  Medication Sig Dispense Refill   ciprofloxacin  (CIPRO ) 500 MG tablet Take 1 tablet (500 mg total) by mouth every 12 (twelve) hours. (Patient not taking: Reported on 03/19/2023) 10 tablet 0   ibuprofen (ADVIL,MOTRIN) 200 MG tablet Take 800 mg by mouth every 6 (six) hours as needed (For pain.).  (Patient not taking: Reported on 03/19/2023)     No facility-administered medications prior to visit.   Past Medical History:  Diagnosis Date   Carpal tunnel syndrome of right wrist 03/2011   Diverticulosis    Endometriosis    Hx of suicide attempt  Liver damage    due to past overdose of Acetaminophen   Past Surgical History:  Procedure Laterality Date   CARPAL TUNNEL RELEASE  04/12/2011   Procedure: CARPAL TUNNEL RELEASE;  Surgeon: Franky JONELLE Curia, MD;  Location: [facility];  Service: Orthopedics;  Laterality: Right;   DIAGNOSTIC LAPAROSCOPY  03/13/2004   and EUA   SALPINGECTOMY  08/24/2005   right; laparoscopic    Objective:    Today's Vitals: BP 121/79   Pulse 85   Temp 97.6 F (36.4 C) (Temporal)   Ht 5' 1 (1.549 m)   Wt 197 lb 9.6 oz (89.6 kg)   LMP 03/15/2023 (Exact Date)   SpO2 98%   BMI 37.34 kg/m   Physical Exam Vitals and nursing note reviewed.  Constitutional:      Appearance: Normal appearance.  Cardiovascular:     Rate and Rhythm: Normal rate and regular rhythm.  Pulmonary:     Effort: Pulmonary effort is normal.     Breath sounds: Normal breath sounds.  Musculoskeletal:        General: Normal range of motion.  Skin:    General: Skin is warm and dry.  Neurological:     Mental Status: She is alert.  Psychiatric:        Mood and Affect: Mood normal.        Behavior: Behavior normal.    Meds ordered this encounter  Medications   lisdexamfetamine (VYVANSE ) 30 MG capsule    Sig: Take 1 capsule (30 mg total) by mouth daily.    Dispense:  30 capsule    Refill:  0    Supervising Provider:   ANDY, CAMILLE L [[id number]]   sertraline  (ZOLOFT ) 25 MG tablet    Sig: Take 1 tablet (25 mg total) by mouth daily.    Dispense:  30 tablet    Refill:  2    Supervising Provider:   ANDY, CAMILLE L [[id number]]   hydrOXYzine  (ATARAX ) 25 MG tablet    Sig: Take 1 tablet (25 mg total) by mouth 2 (two) times daily as needed for anxiety (insomnia). Take one 25 mg tablet 30-60 minutes prior to bedtime for insomnia, anxiety. May increase to two tablets.    Dispense:  60 tablet    Refill:  2    Supervising Provider:   ANDY, CAMILLE L [[id number]]    Lucius Krabbe, NP

## 2023-03-19 NOTE — Patient Instructions (Addendum)
 Welcome to [facility], It was a pleasure meeting you today!   Please schedule a 1 month follow up visit to discuss the new meds started today, ok if this is a virtual visit. You can schedule a  physical with fasting labs at your convenience today.     PLEASE NOTE: If you had any LAB tests please let us  know if you have not heard back within a few days. You may see your results on MyChart before we have a chance to review them but we will give you a call once they are reviewed by us . If we ordered any REFERRALS today, please let us  know if you have not heard from their office within the next week.  Let us  know through MyChart if you are needing REFILLS, or have your pharmacy send us  the request. You can also use MyChart to communicate with me or any office staff.  Please try these tips to maintain a healthy lifestyle: It is important that you exercise regularly at least 30 minutes 5 times a week. Think about what you will eat, plan ahead. Choose whole foods, & think  clean, green, fresh or frozen over canned, processed or packaged foods which are more sugary, salty, and fatty. 70 to 75% of food eaten should be fresh vegetables and protein. 2-3  meals daily with healthy snacks between meals, but must be whole fruit, protein or vegetables. Aim to eat over a 10 hour period when you are active, for example, 7am to 5pm, and then STOP after your last meal of the day, drinking only water.  Shorter eating windows, 6-8 hours, are showing benefits in heart disease and blood sugar regulation. Drink water every day! Shoot for 64 ounces daily = 8 cups, no other drink is as healthy! Fruit juice is best enjoyed in a healthy way, by EATING the fruit.

## 2023-03-19 NOTE — Assessment & Plan Note (Signed)
 Significant symptoms of depression and anxiety reported after discontinuation of previous medications. Patient has a history of being on multiple medications including Trazodone, Sertraline , and Hydroxyzine . -Start Hydroxyzine  25mg  bid prn, ok to cut pill in half if causes drowsiness. -Start Sertraline  25mg  daily. -Consider therapy referral, although patient has had negative experiences in the past. -F/U in 1 month

## 2023-03-20 ENCOUNTER — Telehealth: Payer: Self-pay | Admitting: Family

## 2023-03-20 DIAGNOSIS — F908 Attention-deficit hyperactivity disorder, other type: Secondary | ICD-10-CM

## 2023-03-20 NOTE — Telephone Encounter (Signed)
I thought she asked to have a different med called in, because the generic Vyvanse was too expensive? Does she still want this med?

## 2023-03-20 NOTE — Telephone Encounter (Signed)
 I called pt, unable to leave a voicemail due to vm box being full.

## 2023-03-20 NOTE — Telephone Encounter (Signed)
 Copied from CRM [id number]. Topic: Clinical - Prescription Issue >> Mar 20, 2023  9:08 AM Karen Flowers wrote: Reason for CRM: Informed patient telephone call 03/19/23 : Using the good RX card, generic XR [facility], at [facility] it is $100. She needs to give her insurance info first, but then [facility] can be an option.   Patient wants lisdexamfetamine (VYVANSE ) 30 MG capsule sent to  [id number] - [place], [place] -[address] [place] [address] [phone] Please call back [phone] if there's any issues.

## 2023-03-21 ENCOUNTER — Other Ambulatory Visit: Payer: Self-pay | Admitting: Family

## 2023-03-21 DIAGNOSIS — F908 Attention-deficit hyperactivity disorder, other type: Secondary | ICD-10-CM

## 2023-03-21 NOTE — Telephone Encounter (Signed)
 Copied from CRM [id number]. Topic: Clinical - Medication Refill >> Mar 21, 2023 11:38 AM Kara C wrote: Most Recent Primary Care Visit:  Provider: LUCIUS KRABBE  Department: [facility]  Visit Type: NEW PT - OFFICE VISIT  Date: 03/19/2023  Medication: lisdexamfetamine (VYVANSE ) 30 MG capsule  Has the patient contacted their pharmacy? Yes, they have not received the RX refill because it was sent to the wrong location. (Agent: If no, request that the patient contact the pharmacy for the refill. If patient does not wish to contact the pharmacy document the reason why and proceed with request.) (Agent: If yes, when and what did the pharmacy advise?)  Is this the correct pharmacy for this prescription? Yes If no, delete pharmacy and type the correct one.  This is the patient's preferred pharmacy:  [id number]  [facility] [place], [place] - [address] [place] [address] Phone: [phone] Fax: [phone]   Has the prescription been filled recently? Yes  Is the patient out of the medication? Yes  Has the patient been seen for an appointment in the last year OR does the patient have an upcoming appointment? Yes  Can we respond through MyChart? No  Agent: Please be advised that Rx refills may take up to 3 business days. We ask that you follow-up with your pharmacy.

## 2023-03-22 MED ORDER — AMPHETAMINE-DEXTROAMPHET ER 15 MG PO CP24: 15.0000 mg | ORAL_CAPSULE | ORAL | 0 refills | Status: DC

## 2023-03-22 NOTE — Addendum Note (Signed)
 Addended by: Shaakira Borrero on: 03/22/2023 10:29 AM   Modules accepted: Orders

## 2023-03-22 NOTE — Telephone Encounter (Signed)
 I am sending generic Adderall XR 15mg  [facility] [address] [facility] [place] per last message.

## 2023-04-17 ENCOUNTER — Ambulatory Visit: Payer: No Typology Code available for payment source | Admitting: Family

## 2023-04-17 ENCOUNTER — Encounter: Payer: Self-pay | Admitting: Family

## 2023-04-17 VITALS — BP 130/70 | HR 76 | Temp 98.0°F | Ht 61.0 in | Wt 200.0 lb

## 2023-04-17 DIAGNOSIS — F419 Anxiety disorder, unspecified: Secondary | ICD-10-CM | POA: Diagnosis not present

## 2023-04-17 DIAGNOSIS — F908 Attention-deficit hyperactivity disorder, other type: Secondary | ICD-10-CM

## 2023-04-17 DIAGNOSIS — F331 Major depressive disorder, recurrent, moderate: Secondary | ICD-10-CM | POA: Diagnosis not present

## 2023-04-17 MED ORDER — SERTRALINE HCL 50 MG PO TABS: 50.0000 mg | ORAL_TABLET | Freq: Every day | ORAL | 2 refills | Status: DC

## 2023-04-17 MED ORDER — AMPHETAMINE-DEXTROAMPHET ER 15 MG PO CP24: 15.0000 mg | ORAL_CAPSULE | ORAL | 0 refills | Status: DC

## 2023-04-17 MED ORDER — SERTRALINE HCL 25 MG PO TABS: 50.0000 mg | ORAL_TABLET | Freq: Every day | ORAL | Status: DC

## 2023-04-17 NOTE — Assessment & Plan Note (Deleted)
 Current Zoloft dose insufficient, causing emotional lability and crying spells. Adjusting dose and timing may improve symptoms and reduce drowsiness. Reluctant to start therapy due to communication discomfort. Therapy encouraged for mental health. - Increase Zoloft to 50 mg at bedtime. - Provided MyChart access support including phone number and front desk assistance. - Follow up in one month via MyChart to assess medication changes and progress. -Schedule regular 3 mo f/u, ok if virtual

## 2023-04-17 NOTE — Assessment & Plan Note (Signed)
 Current Zoloft dose insufficient, causing emotional lability and crying spells. Adjusting dose and timing may improve symptoms and reduce drowsiness. Reluctant to start therapy due to communication discomfort. Therapy encouraged for mental health. - Increase Zoloft to 50 mg at bedtime. - Provided MyChart access support including phone number and front desk assistance. - Follow up in one month via MyChart to assess medication changes and progress. -Schedule regular 3 mo f/u, ok if virtual

## 2023-04-17 NOTE — Progress Notes (Signed)
 Patient ID: Karen Flowers, female    DOB: 1980-12-13, 43 y.o.   MRN: [id number]  Chief Complaint  Patient presents with   Depression    Pt states she has been very emotional since last visit. Pt states that she is eating more she believes it is one of the new medication she is on.   ADHD           Discussed the use of AI scribe software for clinical note transcription with the patient, who gave verbal consent to proceed.  History of Present Illness   Karen Flowers is a 43 year old female who presents for follow up with increased emotional lability and medication management. She experiences increased emotional lability, describing herself as 'extra crying' and crying for no apparent reason. Previously, she was able to manage her emotions more effectively. She has been taking Zoloft in the morning but feels it has not yet helped with her emotional symptoms. She has a history of using Prozac, which she recalls was not effective. She is currently taking Adderall in the morning, which she started about a month ago after previously using Vyvanse. She reports that Adderall helps with focus and attention in the morning but starts to wear off around lunchtime. She takes her medications at 6 AM daily. She has been using hydroxyzine at night to aid with sleep but believes it causes her to wake up and eat during the night, leading to increased hunger. She has not been taking it regularly due to this side effect.     Assessment & Plan:     Depression - Current Zoloft dose insufficient, causing emotional lability and crying spells. Adjusting dose and timing may improve symptoms and reduce drowsiness. Reluctant to start therapy due to communication discomfort. Therapy encouraged for mental health. - Increase Zoloft to 50 mg at bedtime. - Provided MyChart access support including phone number and front desk assistance. - Follow up in one month via MyChart to assess medication changes and progress. -Schedule  regular 3 mo f/u, ok if virtual  Attention Deficit Hyperactivity Disorder (ADHD) - Adderall effects wear off by lunchtime. Increasing dose won't extend duration. Vyvanse discontinued due to cost. Reassess Adderall effectiveness in one month via MyChart message. - Continue current Adderall dose, [facility] [address] [facility]. - Evaluate effectiveness in one month. -Regular f/u in 3 mos. - virtual ok  Insomnia - Hydroxyzine may contribute to nighttime eating behavior. Dose adjustment may help. - Consider reducing hydroxyzine dose at night to 1/2 pill. -F/U 3 mos    Subjective:    Outpatient Medications Prior to Visit  Medication Sig Dispense Refill   amphetamine-dextroamphetamine (ADDERALL XR) 15 MG 24 hr capsule Take 1 capsule by mouth every morning. 30 capsule 0   hydrOXYzine (ATARAX) 25 MG tablet Take 1 tablet (25 mg total) by mouth 2 (two) times daily as needed for anxiety (insomnia). Take one 25 mg tablet 30-60 minutes prior to bedtime for insomnia, anxiety. May increase to two tablets. 60 tablet 2   sertraline (ZOLOFT) 25 MG tablet Take 1 tablet (25 mg total) by mouth daily. 30 tablet 2   No facility-administered medications prior to visit.   Past Medical History:  Diagnosis Date   Carpal tunnel syndrome of right wrist 03/2011   Diverticulosis    Endometriosis    Hx of suicide attempt    Liver damage    due to past overdose of Acetaminophen   Past Surgical History:  Procedure Laterality Date   CARPAL TUNNEL RELEASE  04/12/2011   Procedure: CARPAL TUNNEL RELEASE;  Surgeon: Tami Ribas, MD;  Location: [facility];  Service: Orthopedics;  Laterality: Right;   DIAGNOSTIC LAPAROSCOPY  03/13/2004   and EUA   SALPINGECTOMY  08/24/2005   right; laparoscopic   Allergies  Allergen Reactions   Acetaminophen Other (See Comments)    LIVER DAMAGE   Latex Itching   Penicillins Other (See Comments)    YEAST INFECTION      Objective:     Physical Exam Vitals and nursing note reviewed.  Constitutional:      Appearance: Normal appearance.  Cardiovascular:     Rate and Rhythm: Normal rate and regular rhythm.  Pulmonary:     Effort: Pulmonary effort is normal.     Breath sounds: Normal breath sounds.  Musculoskeletal:        General: Normal range of motion.  Skin:    General: Skin is warm and dry.  Neurological:     Mental Status: She is alert.  Psychiatric:        Mood and Affect: Mood normal.        Behavior: Behavior normal.    BP 130/70   Pulse 76   Temp 98 F (36.7 C) (Temporal)   Ht 5\' 1"  (1.549 m)   Wt 200 lb (90.7 kg)   LMP 03/20/2023 (Approximate)   SpO2 98%   BMI 37.79 kg/m  Wt Readings from Last 3 Encounters:  04/17/23 200 lb (90.7 kg)  03/19/23 197 lb 9.6 oz (89.6 kg)  06/11/13 180 lb 6.4 oz (81.8 kg)      Karen Sellar, NP

## 2023-04-17 NOTE — Patient Instructions (Addendum)
 It was very nice to see you today!    Increase the Zoloft (Sertraline) to 2 pills= 50mg  and take at bedtime ([facility] [address]).  Ok to take with the Hydroxyzine. Try 1/2 pill of Hydroxyzine if think making you hungry, but may not be strong enough for relaxing or sleep. Stay on same dose of Adderall for this month. Message me on MyChart in one month and let me know if still drowsy mid day, or still emotional.  Schedule a 3 month follow up and can be virtual (MyChart!).    PLEASE NOTE:  If you had any lab tests please let us know if you have not heard back within a few days. You may see your results on MyChart before we have a chance to review them but we will give you a call once they are reviewed by [place]. If we ordered any referrals today, please let us know if you have not heard from their office within the next week.

## 2023-04-17 NOTE — Assessment & Plan Note (Signed)
 Adderall effects wear off by lunchtime. Increasing dose won't extend duration. Vyvanse discontinued due to cost. Reassess Adderall effectiveness in one month via MyChart message. - Continue current Adderall dose, [facility] [address] per request. - Evaluate effectiveness in one month. -Regular f/u in 3 mos. - virtual ok

## 2023-05-09 ENCOUNTER — Other Ambulatory Visit: Payer: Self-pay | Admitting: Family

## 2023-05-09 DIAGNOSIS — F419 Anxiety disorder, unspecified: Secondary | ICD-10-CM

## 2023-05-20 ENCOUNTER — Other Ambulatory Visit: Payer: Self-pay | Admitting: Family

## 2023-05-20 DIAGNOSIS — F908 Attention-deficit hyperactivity disorder, other type: Secondary | ICD-10-CM

## 2023-05-21 MED ORDER — AMPHETAMINE-DEXTROAMPHET ER 15 MG PO CP24: 15.0000 mg | ORAL_CAPSULE | ORAL | 0 refills | Status: DC

## 2023-05-22 ENCOUNTER — Ambulatory Visit: Admitting: Family

## 2023-05-28 ENCOUNTER — Ambulatory Visit (INDEPENDENT_AMBULATORY_CARE_PROVIDER_SITE_OTHER): Admitting: Family

## 2023-05-28 ENCOUNTER — Encounter: Payer: Self-pay | Admitting: Family

## 2023-05-28 DIAGNOSIS — Z9889 Other specified postprocedural states: Secondary | ICD-10-CM

## 2023-05-28 DIAGNOSIS — N63 Unspecified lump in unspecified breast: Secondary | ICD-10-CM

## 2023-05-28 NOTE — Progress Notes (Signed)
 Patient ID: Karen Flowers, female    DOB: 11-24-1980, 43 y.o.   MRN: [id number]  Chief Complaint  Patient presents with   Breast Mass    Pt c/o lump on left breast, Noticed 2 weeks ago. Pt denies pain. Biopsy done in 2022.   Discussed the use of AI scribe software for clinical note transcription with the patient, who gave verbal consent to proceed.  History of Present Illness The patient presents with a lump in her breast, which she noticed a couple of weeks ago while showering. 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 The patient reports no pain associated with the current lump. She has not had a mammogram in several years, since the time of her last biopsy. The patient also mentions that she thinks she has dense breast tissue. In addition to the breast lump, the patient requests a Pap smear. She has not had a physical examination recently and has not previously seen a gynecologist.  Assessment & Plan Breast Lump Previously biopsied fibroadenomas. Lump not palpable today. Dense breast tissue. Overdue for mammogram. - Order diagnostic mammogram. - Order breast ultrasound if indicated. - [facility] will call to schedule.  General Health Maintenance Due for Pap smear and physical examination. No prior gynecologist visit. - Schedule Pap smear with physical examination. - Advise fasting for labs if performed during physical examination.   Subjective:    Outpatient Medications Prior to Visit  Medication Sig Dispense Refill   [START ON 06/20/2023] amphetamine-dextroamphetamine (ADDERALL XR) 15 MG 24 hr capsule Take 1 capsule by mouth every morning. 30 capsule 0   amphetamine-dextroamphetamine (ADDERALL XR) 15 MG 24 hr capsule Take 1 capsule by mouth every morning. 30 capsule 0   hydrOXYzine (ATARAX) 25 MG tablet Take 1 tablet (25 mg total) by mouth 2 (two) times daily as needed for anxiety (insomnia). Take one 25 mg  tablet 30-60 minutes prior to bedtime for insomnia, anxiety. May increase to two tablets. 60 tablet 2   sertraline (ZOLOFT) 50 MG tablet Take 1 tablet (50 mg total) by mouth daily. 30 tablet 2   No facility-administered medications prior to visit.   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   Carpal tunnel syndrome of right wrist 03/16/2011   Diverticulosis    Endometriosis    Hx of suicide attempt    Liver damage    due to past overdose of Acetaminophen   Past Surgical History:  Procedure Laterality Date   CARPAL TUNNEL RELEASE  04/12/2011   Procedure: CARPAL TUNNEL RELEASE;  Surgeon: Tami Ribas, MD;  Location: [facility];  Service: Orthopedics;  Laterality: Right;   DIAGNOSTIC LAPAROSCOPY  03/13/2004   and EUA   SALPINGECTOMY  08/24/2005   right; laparoscopic   Allergies  Allergen Reactions   Acetaminophen Other (See Comments)    LIVER DAMAGE   Latex Itching   Penicillins Other (See Comments)    YEAST INFECTION      Objective:    Physical Exam Vitals and nursing note reviewed.  Constitutional:      Appearance: Normal appearance.  Cardiovascular:     Rate and Rhythm: Normal rate and regular rhythm.  Pulmonary:     Effort: Pulmonary effort is normal.     Breath sounds: Normal breath sounds.  Chest:  Breasts:    Right: Normal. No mass, skin change or  tenderness.     Left: Normal. No mass, nipple discharge, skin change or tenderness.  Musculoskeletal:        General: Normal range of motion.  Skin:    General: Skin is warm and dry.  Neurological:     Mental Status: She is alert.  Psychiatric:        Mood and Affect: Mood normal.        Behavior: Behavior normal.    BP 126/84 (BP Location: Left Arm, Patient Position: Sitting, Cuff Size: Large)   Pulse (!) 103   Temp 97.9 F (36.6 C) (Temporal)   Ht 5\' 1"  (1.549 m)   Wt 202 lb 8 oz (91.9 kg)   LMP 05/24/2023  (Approximate)   SpO2 96%   BMI 38.26 kg/m  Wt Readings from Last 3 Encounters:  05/28/23 202 lb 8 oz (91.9 kg)  04/17/23 200 lb (90.7 kg)  03/19/23 197 lb 9.6 oz (89.6 kg)      Versa Gore, NP

## 2023-06-13 ENCOUNTER — Other Ambulatory Visit: Payer: Self-pay | Admitting: Family

## 2023-06-13 DIAGNOSIS — F419 Anxiety disorder, unspecified: Secondary | ICD-10-CM

## 2023-07-17 ENCOUNTER — Other Ambulatory Visit: Payer: Self-pay

## 2023-07-17 ENCOUNTER — Other Ambulatory Visit: Payer: Self-pay | Admitting: Family

## 2023-07-17 DIAGNOSIS — F32A Depression, unspecified: Secondary | ICD-10-CM

## 2023-07-17 MED ORDER — SERTRALINE HCL 50 MG PO TABS: 50.0000 mg | ORAL_TABLET | Freq: Every day | ORAL | 2 refills | Status: DC

## 2023-07-18 ENCOUNTER — Ambulatory Visit: Admitting: Family

## 2023-07-25 ENCOUNTER — Encounter: Admitting: Family

## 2023-07-25 NOTE — Progress Notes (Deleted)
 Phone [phone]  Subjective:   Patient is a 43 y.o. female presenting for annual physical.    No chief complaint on file.   See problem oriented charting- ROS- full  review of systems was completed and negative except for: ***noted in HPI above.  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   Carpal tunnel syndrome of right wrist 03/16/2011   Diverticulosis    Endometriosis    Hx of suicide attempt    Liver damage    due to past overdose of Acetaminophen   Patient Active Problem List   Diagnosis Date Noted   Class 2 obesity due to excess calories without serious comorbidity with body mass index (BMI) of 37.0 to 37.9 in adult 03/19/2023   Anxiety and depression 03/19/2023   ADHD, adult residual type 03/19/2023   Major depressive disorder, recurrent episode, moderate (HCC) 02/10/2020   Past Surgical History:  Procedure Laterality Date   CARPAL TUNNEL RELEASE  04/12/2011   Procedure: CARPAL TUNNEL RELEASE;  Surgeon: Milagros Alf, MD;  Location: [facility];  Service: Orthopedics;  Laterality: Right;   DIAGNOSTIC LAPAROSCOPY  03/13/2004   and EUA   SALPINGECTOMY  08/24/2005   right; laparoscopic    Family History  Problem Relation Age of Onset   Diabetes Mother    Depression Mother    Arthritis Mother    Cancer Mother    Cancer Father     Medications- reviewed and updated Current Outpatient Medications  Medication Sig Dispense Refill   amphetamine -dextroamphetamine (ADDERALL XR) 15 MG 24 hr capsule Take 1 capsule by mouth every morning. 30 capsule 0   amphetamine -dextroamphetamine (ADDERALL XR) 15 MG 24 hr capsule Take 1 capsule by mouth every morning. 30 capsule 0   hydrOXYzine  (ATARAX ) 25 MG tablet Take 1 tablet (25 mg total) by mouth 2 (two) times daily as needed for anxiety (insomnia). Take one 25 mg tablet 30-60  minutes prior to bedtime for insomnia, anxiety. May increase to two tablets. 60 tablet 2   sertraline  (ZOLOFT ) 50 MG tablet TAKE 1 TABLET BY MOUTH EVERY DAY 30 tablet 2   sertraline  (ZOLOFT ) 50 MG tablet Take 1 tablet (50 mg total) by mouth daily. 30 tablet 2   No current facility-administered medications for this visit.    Allergies-reviewed and updated Allergies  Allergen Reactions   Acetaminophen Other (See Comments)    LIVER DAMAGE   Latex Itching   Penicillins Other (See Comments)    YEAST INFECTION    Social History   Social History Narrative   Not on file    Objective:  There were no vitals taken for this visit. Physical Exam Vitals and nursing note reviewed.  Constitutional:      Appearance: Normal appearance.  HENT:     Head: Normocephalic.     Right Ear: Tympanic membrane normal.     Left Ear: Tympanic membrane normal.     Nose: Nose normal.     Mouth/Throat:     Mouth: Mucous membranes are moist.   Eyes:     Pupils: Pupils are equal, round, and reactive to light.    Cardiovascular:     Rate and Rhythm: Normal rate and regular rhythm.  Pulmonary:     Effort: Pulmonary effort is normal.     Breath sounds: Normal breath sounds.   Musculoskeletal:        General: Normal range  of motion.     Cervical back: Normal range of motion.  Lymphadenopathy:     Cervical: No cervical adenopathy.   Skin:    General: Skin is warm and dry.   Neurological:     Mental Status: She is alert.   Psychiatric:        Mood and Affect: Mood normal.        Behavior: Behavior normal.       Assessment and Plan   Health Maintenance counseling: 1. Anticipatory guidance: Patient counseled regarding regular dental exams q6 months, eye exams,  avoiding smoking and second hand smoke, limiting alcohol to 1 beverage per day, no illicit drugs.   2. Risk factor reduction:  Advised patient of need for regular exercise and diet rich with fruits and vegetables to reduce risk of  heart attack and stroke. Exercise- ***.  Wt Readings from Last 3 Encounters:  05/28/23 202 lb 8 oz (91.9 kg)  04/17/23 200 lb (90.7 kg)  03/19/23 197 lb 9.6 oz (89.6 kg)   3. Immunizations/screenings/ancillary studies  There is no immunization history on file for this patient. Health Maintenance Due  Topic Date Due   HPV VACCINES (1 - 3-dose series) Never done    4. Cervical cancer screening- *** 5. Breast cancer screening-  mammogram *** 6. Colon cancer screening - *** 7. Skin cancer screening- advised regular sunscreen use. Denies worrisome, changing, or new skin lesions.  8. Birth control/STD check- *** 9. Osteoporosis screening- *** 10. Alcohol screening: *** 11. Smoking associated screening (lung cancer screening, AAA screen 65-75, UA)- *** smoker- ***ppd  Annual physical exam    Recommended follow up: ***No follow-ups on file. Future Appointments  Date Time Provider Department Center  07/25/2023 10:30 AM Versa Gore, NP [facility]    Lab/Order associations:fasting   Versa Gore, NP

## 2023-07-26 ENCOUNTER — Ambulatory Visit: Admitting: Family

## 2023-07-29 ENCOUNTER — Encounter: Payer: Self-pay | Admitting: Family

## 2023-07-29 ENCOUNTER — Ambulatory Visit: Admitting: Family

## 2023-07-29 VITALS — BP 106/76 | HR 97 | Temp 98.1°F | Ht 60.0 in | Wt 201.6 lb

## 2023-07-29 DIAGNOSIS — F908 Attention-deficit hyperactivity disorder, other type: Secondary | ICD-10-CM

## 2023-07-29 DIAGNOSIS — F32A Depression, unspecified: Secondary | ICD-10-CM

## 2023-07-29 DIAGNOSIS — F419 Anxiety disorder, unspecified: Secondary | ICD-10-CM | POA: Diagnosis not present

## 2023-07-29 MED ORDER — AMPHETAMINE-DEXTROAMPHET ER 15 MG PO CP24
15.0000 mg | ORAL_CAPSULE | ORAL | 0 refills | Status: DC
Start: 2023-07-29 — End: 2023-09-25

## 2023-07-29 MED ORDER — AMPHETAMINE-DEXTROAMPHETAMINE 10 MG PO TABS
10.0000 mg | ORAL_TABLET | Freq: Every day | ORAL | 0 refills | Status: DC
Start: 2023-07-29 — End: 2023-08-28

## 2023-07-29 NOTE — Assessment & Plan Note (Signed)
 Adderall XR less effective than Vyvanse , but Vyvanse  unaffordable. Does not appear PA was done for the Vyvanse . Discussed trying short-acting Adderall after lunch to manage symptoms without affecting sleep. - Refill 30-day supply of Adderall XR 15mg  qam. - Prescribe short-acting Adderall (10mg ) for midday use, halve dose if needed. - Send MyChart message or call if additional 10mg  dose is working. - Advise to present insurance card at pharmacy, if not covered, then use [employer] card. - F/U in 3mos ok if virtual

## 2023-07-29 NOTE — Assessment & Plan Note (Signed)
 On Zoloft  for anxiety and depression. Working well, no side effects reported. - Refill sent recently. - F/U in 6 mos

## 2023-07-29 NOTE — Progress Notes (Signed)
 Patient ID: Karen Flowers, female    DOB: [birth year], 43 y.o.   MRN: [id number]  Chief Complaint  Patient presents with   Medication Follow Up    Patient states no side effects on medications.   Discussed the use of AI scribe software for clinical note transcription with the patient, who gave verbal consent to proceed.  History of Present Illness Karen Flowers is a 43 year old female with ADHD, anxiety and depression who presents for medication management.  She is currently taking Adderall XR 15mg  and Zoloft  50mg  (sertraline ) for anxiety and depression. She clarified that she is not taking hydroxyzine . She experiences anxiety, particularly related to insurance and medication costs. Zoloft  is less effective than Vyvanse , which she previously took at 30 mg in the morning and 20 mg at noon. Vyvanse  was effective but affected her sleep, and she cannot afford it due to not being covered by her insurance. She uses [facility] for Adderall as [employer] does not cover it. She denies side effects from Zoloft , such as jitteriness or insomnia, and maintains a regular sleep schedule, waking at 6:30-7:00 AM and sleeping by 9:00 PM.  Assessment & Plan Attention Deficit Hyperactivity Disorder (ADHD) Adderall XR less effective than Vyvanse , but Vyvanse  unaffordable. Does not appear PA was done for the Vyvanse . Discussed trying short-acting Adderall after lunch to manage symptoms without affecting sleep. - Refill 30-day supply of Adderall XR 15mg  qam. - Prescribe short-acting Adderall (10mg ) for midday use, halve dose if needed. - Send MyChart message or call if additional 10mg  dose is working. - Advise to present insurance card at pharmacy, if not covered, then use [employer] card. - F/U in 3mos ok if virtual  Anxiety and Depression On Zoloft  for anxiety and depression. Working well, no side effects reported. - Refill sent recently. - F/U in 6 mos      Subjective:    Outpatient Medications Prior to  Visit  Medication Sig Dispense Refill   amphetamine -dextroamphetamine (ADDERALL XR) 15 MG 24 hr capsule Take 1 capsule by mouth every morning. 30 capsule 0   sertraline  (ZOLOFT ) 50 MG tablet TAKE 1 TABLET BY MOUTH EVERY DAY 30 tablet 2   amphetamine -dextroamphetamine (ADDERALL XR) 15 MG 24 hr capsule Take 1 capsule by mouth every morning. (Patient not taking: Reported on 07/29/2023) 30 capsule 0   hydrOXYzine  (ATARAX ) 25 MG tablet Take 1 tablet (25 mg total) by mouth 2 (two) times daily as needed for anxiety (insomnia). Take one 25 mg tablet 30-60 minutes prior to bedtime for insomnia, anxiety. May increase to two tablets. (Patient not taking: Reported on 07/29/2023) 60 tablet 2   sertraline  (ZOLOFT ) 50 MG tablet Take 1 tablet (50 mg total) by mouth daily. (Patient not taking: Reported on 07/29/2023) 30 tablet 2   No facility-administered medications prior to visit.   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   Carpal tunnel syndrome of right wrist 03/16/2011   Diverticulosis    Endometriosis    Hx of suicide attempt    Liver damage    due to past overdose of Acetaminophen   Past Surgical History:  Procedure Laterality Date   CARPAL TUNNEL RELEASE  04/12/2011   Procedure: CARPAL TUNNEL RELEASE;  Surgeon: Milagros Alf, MD;  Location: [facility];  Service: Orthopedics;  Laterality: Right;   DIAGNOSTIC LAPAROSCOPY  03/13/2004   and EUA   SALPINGECTOMY  08/24/2005   right; laparoscopic   Allergies  Allergen Reactions   Acetaminophen Other (See Comments)    LIVER DAMAGE   Latex Itching   Penicillins Other (See Comments)    YEAST INFECTION      Objective:    Physical Exam Vitals and nursing note reviewed.  Constitutional:      Appearance: Normal appearance.   Cardiovascular:     Rate and Rhythm: Normal rate and regular rhythm.  Pulmonary:     Effort: Pulmonary effort is normal.     Breath  sounds: Normal breath sounds.   Musculoskeletal:        General: Normal range of motion.   Skin:    General: Skin is warm and dry.   Neurological:     Mental Status: She is alert.   Psychiatric:        Mood and Affect: Mood normal.        Behavior: Behavior normal.   BP 106/76   Pulse 97   Temp 98.1 F (36.7 C) (Temporal)   Ht 5' (1.524 m)   Wt 201 lb 9.6 oz (91.4 kg)   LMP 07/21/2023 (Approximate)   SpO2 99%   BMI 39.37 kg/m  Wt Readings from Last 3 Encounters:  07/29/23 201 lb 9.6 oz (91.4 kg)  05/28/23 202 lb 8 oz (91.9 kg)  04/17/23 200 lb (90.7 kg)      Versa Gore, NP

## 2023-08-27 ENCOUNTER — Other Ambulatory Visit: Payer: Self-pay | Admitting: Family

## 2023-08-27 DIAGNOSIS — F419 Anxiety disorder, unspecified: Secondary | ICD-10-CM

## 2023-08-28 ENCOUNTER — Other Ambulatory Visit: Payer: Self-pay | Admitting: Family

## 2023-08-28 DIAGNOSIS — F908 Attention-deficit hyperactivity disorder, other type: Secondary | ICD-10-CM

## 2023-08-28 DIAGNOSIS — F419 Anxiety disorder, unspecified: Secondary | ICD-10-CM

## 2023-08-28 MED ORDER — SERTRALINE HCL 50 MG PO TABS: 50.0000 mg | ORAL_TABLET | Freq: Every day | ORAL | 0 refills | Status: DC

## 2023-08-28 MED ORDER — AMPHETAMINE-DEXTROAMPHET ER 15 MG PO CP24
15.0000 mg | ORAL_CAPSULE | ORAL | 0 refills | Status: DC
Start: 2023-08-28 — End: 2023-09-25

## 2023-08-28 MED ORDER — AMPHETAMINE-DEXTROAMPHETAMINE 10 MG PO TABS: 10.0000 mg | ORAL_TABLET | Freq: Every day | ORAL | 0 refills | Status: DC

## 2023-08-28 NOTE — Telephone Encounter (Signed)
 Copied from CRM [id number]. Topic: Clinical - Medication Refill >> Aug 28, 2023 10:10 AM Emylou G wrote: Medication: amphetamine -dextroamphetamine  (ADDERALL XR) 15 MG 24 hr capsule amphetamine -dextroamphetamine  (ADDERALL) 10 MG tablet sertraline  (ZOLOFT ) 50 MG tablet   Has the patient contacted their pharmacy? No (Agent: If no, request that the patient contact the pharmacy for the refill. If patient does not wish to contact the pharmacy document the reason why and proceed with request.) (Agent: If yes, when and what did the pharmacy advise?)  This is the patient's preferred pharmacy:  [id number] [address] [place], [place] - [address] [place] [address] Phone: [phone] Fax: [phone]    Is this the correct pharmacy for this prescription? Yes If no, delete pharmacy and type the correct one.   Has the prescription been filled recently? No  Is the patient out of the medication? Yes  Has the patient been seen for an appointment in the last year OR does the patient have an upcoming appointment? Yes  Can we respond through MyChart? Yes  Agent: Please be advised that Rx refills may take up to 3 business days. We ask that you follow-up with your pharmacy.

## 2023-09-24 ENCOUNTER — Ambulatory Visit: Payer: Self-pay | Admitting: *Deleted

## 2023-09-24 NOTE — Telephone Encounter (Signed)
 Copied from CRM [id number]. Topic: Clinical - Red Word Triage >> Sep 24, 2023  8:28 AM Frederich PARAS wrote: [facility] that prompted transfer to Nurse Triage: pain  Pt hurt her back 2 days ago, cant roll over in the bead, working on phone, pain, alot of pain, she could not get out of bed yesterday due to the pain. Reason for Disposition  [1] MODERATE back pain (e.g., interferes with normal activities) AND [2] present > 3 days  Answer Assessment - Initial Assessment Questions 1. ONSET: When did the pain begin? (e.g., minutes, hours, days)     My lower back is hurting for 2 days now.   I was digging holes with post hole diggers and messed up my back.    2. LOCATION: Where does it hurt? (upper, mid or lower back)     Lower back  3. SEVERITY: How bad is the pain?  (e.g., Scale 1-10; mild, moderate, or severe)     Severe 4. PATTERN: Is the pain constant? (e.g., yes, no; constant, intermittent)      It's constant I'm using patches, ibuprofen, Tylenol too and I'm still in pain.   5. RADIATION: Does the pain shoot into your legs or somewhere else?     No 6. CAUSE:  What do you think is causing the back pain?      I was digging post holes with post hole diggers 7. BACK OVERUSE:  Any recent lifting of heavy objects, strenuous work or exercise?     Yes see above 8. MEDICINES: What have you taken so far for the pain? (e.g., nothing, acetaminophen, NSAIDS)     Ibuprofen, Tylenol and patches 9. NEUROLOGIC SYMPTOMS: Do you have any weakness, numbness, or problems with bowel/bladder control?     No 10. OTHER SYMPTOMS: Do you have any other symptoms? (e.g., fever, abdomen pain, burning with urination, blood in urine)       No 11. PREGNANCY: Is there any chance you are pregnant? When was your last menstrual period?       Not asked  Protocols used: Back Pain-A-AH FYI Only or Action Required?: FYI only for provider.  Patient was last seen in primary care on 07/29/2023 by Lucius Krabbe, NP.  Called Nurse Triage reporting Back Pain. Lower back pain from digging post holes with a post hole digger.  Symptoms began several days ago.  Interventions attempted: OTC medications: ibuprofen, Tylenol and pain patches.  Symptoms are: gradually worsening.  Triage Disposition: See PCP When Office is Open (Within 3 Days)  Patient/caregiver understands and will follow disposition?: Yes Scheduled appt at pt's request for Wed. Due to transportation issues.

## 2023-09-25 ENCOUNTER — Ambulatory Visit: Admitting: Family

## 2023-09-25 ENCOUNTER — Encounter: Payer: Self-pay | Admitting: Family

## 2023-09-25 VITALS — BP 113/74 | HR 81 | Temp 98.1°F | Ht 60.0 in | Wt 188.1 lb

## 2023-09-25 DIAGNOSIS — M545 Low back pain, unspecified: Secondary | ICD-10-CM

## 2023-09-25 DIAGNOSIS — F908 Attention-deficit hyperactivity disorder, other type: Secondary | ICD-10-CM

## 2023-09-25 DIAGNOSIS — S30860A Insect bite (nonvenomous) of lower back and pelvis, initial encounter: Secondary | ICD-10-CM

## 2023-09-25 MED ORDER — AMPHETAMINE-DEXTROAMPHET ER 15 MG PO CP24: 15.0000 mg | ORAL_CAPSULE | ORAL | 0 refills | Status: DC

## 2023-09-25 MED ORDER — DICLOFENAC SODIUM 75 MG PO TBEC: 75.0000 mg | DELAYED_RELEASE_TABLET | Freq: Two times a day (BID) | ORAL | 0 refills | Status: DC

## 2023-09-25 MED ORDER — AMPHETAMINE-DEXTROAMPHETAMINE 10 MG PO TABS: 10.0000 mg | ORAL_TABLET | Freq: Every day | ORAL | 0 refills | Status: DC

## 2023-09-25 MED ORDER — CYCLOBENZAPRINE HCL 5 MG PO TABS: 5.0000 mg | ORAL_TABLET | Freq: Three times a day (TID) | ORAL | 1 refills | Status: AC | PRN

## 2023-09-25 MED ORDER — AMPHETAMINE-DEXTROAMPHETAMINE 10 MG PO TABS: 10.0000 mg | ORAL_TABLET | Freq: Every day | ORAL | 0 refills | Status: AC

## 2023-09-25 NOTE — Progress Notes (Signed)
 Patient ID: Karen Flowers, female    DOB: [birth year], 43 y.o.   MRN: [id number]  Chief Complaint  Patient presents with   Back Pain    Pt c/o lower back pain, present on Saturday. Has tried ibuprofen, dual action which did not help sx.    ADHD  Discussed the use of AI scribe software for clinical note transcription with the patient, who gave verbal consent to proceed.  History of Present Illness Karen Flowers is a 43 year old female who presents with back pain and a tick bite.  Lumbosacral back pain - Pain localized to the sacral and lumbar region, slightly more pronounced on one side - No radiation to the legs - Onset after significant physical exertion while digging holes - Ibuprofen taken at four tablets at a time without relief - Avoids dual-action medications due to prior liver concerns  Upper extremity paresthesia - Numbness affecting all fingers on one arm - Described as 'nice and asleep' - Onset approximately one week prior to the onset of back pain  Tick bite - Tick discovered on her back, believed to have been present since [place] - Noticed the tick yesterday - Unable to remove the tick due to its location  Stimulant medication use - Takes Adderall, 15 mg in the morning and 10 mg at lunch, sometimes halving the dose - Current regimen is effective  Assessment & Plan Lumbosacral pain Acute lumbosacral pain likely due to physical exertion. Pain localized to sacrum and lumbar region. Ibuprofen and dual action medications ineffective. - Prescribe diclofenac  sodium 75mg  bid for anti-inflammatory effect. - Prescribe Flexeril  5-10 mg for muscle relaxation, option to take two for sleep, caution drowsiness during day. - Advise against heavy lifting, pulling, or pushing for 1-2 weeks. - Provide written note for work restrictions. - Call back if pain is not improved  Tick bite of left lower back Tick bite on left lower back, likely acquired during  outdoor activity. Tick removed during visit. - Achieved complete removal of tick with tweezers. - Monitor site and call back if any worsening redness, swelling, or pain.  Numbness of left arm and fingers Numbness in left arm and fingers, possibly related to cervical spine or carpal tunnel syndrome. Symptoms preceded recent exertion. - Advised on using wrist brace with hard plate on underside while sleeping and during day as able  Attention-deficit hyperactivity disorder ADHD managed with Adderall. Current regimen: XR 15 mg morning, IR 5-10 mg afternoon, with dose flexibility. Reports good control of sx, no SE. - Continue Adderall XR 15 mg morning, IR 10 mg afternoon. Sending refills x 3 months. - Schedule f/u prior to needing refills, approx 3mos  Subjective:    Outpatient Medications Prior to Visit  Medication Sig Dispense Refill   sertraline  (ZOLOFT ) 50 MG tablet Take 1 tablet (50 mg total) by mouth daily. 90 tablet 0   amphetamine -dextroamphetamine  (ADDERALL XR) 15 MG 24 hr capsule Take 1 capsule by mouth every morning. 30 capsule 0   amphetamine -dextroamphetamine  (ADDERALL XR) 15 MG 24 hr capsule Take 1 capsule by mouth every morning. 30 capsule 0   amphetamine -dextroamphetamine  (ADDERALL) 10 MG tablet Take 1 tablet (10 mg total) by mouth daily after lunch. Ok to cut in half if too strong. 30 tablet 0   hydrOXYzine  (ATARAX ) 25 MG tablet Take 1 tablet (25 mg total) by mouth 2 (two) times daily as needed for anxiety (insomnia). Take one 25 mg tablet 30-60 minutes prior to bedtime  for insomnia, anxiety. May increase to two tablets. (Patient not taking: Reported on 07/29/2023) 60 tablet 2   No facility-administered medications prior to visit.   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   Carpal tunnel syndrome of right wrist 03/16/2011   Diverticulosis    Endometriosis    Hx of suicide attempt    Liver damage     due to past overdose of Acetaminophen   Major depressive disorder, recurrent episode, moderate (HCC) 02/10/2020   Past Surgical History:  Procedure Laterality Date   CARPAL TUNNEL RELEASE  04/12/2011   Procedure: CARPAL TUNNEL RELEASE;  Surgeon: Franky JONELLE Curia, MD;  Location: [facility];  Service: Orthopedics;  Laterality: Right;   DIAGNOSTIC LAPAROSCOPY  03/13/2004   and EUA   SALPINGECTOMY  08/24/2005   right; laparoscopic   Allergies  Allergen Reactions   Acetaminophen Other (See Comments)    LIVER DAMAGE   Latex Itching   Penicillins Other (See Comments)    YEAST INFECTION      Objective:    Physical Exam Vitals and nursing note reviewed.  Constitutional:      Appearance: Normal appearance. She is obese.  Cardiovascular:     Rate and Rhythm: Normal rate and regular rhythm.  Pulmonary:     Effort: Pulmonary effort is normal.     Breath sounds: Normal breath sounds.  Musculoskeletal:     Lumbar back: Bony tenderness (over S1-2 joint) present. No edema or signs of trauma. Decreased range of motion.  Skin:    General: Skin is warm and dry.     Findings: Lesion (pinpoint red bite mark noted after removal of tiny brown, non-engorged tick) present.  Neurological:     Mental Status: She is alert.  Psychiatric:        Mood and Affect: Mood normal.        Behavior: Behavior normal.    BP 113/74 (BP Location: Left Arm, Patient Position: Sitting, Cuff Size: Large)   Pulse 81   Temp 98.1 F (36.7 C) (Temporal)   Ht 5' (1.524 m)   Wt 188 lb 2 oz (85.3 kg)   SpO2 98%   BMI 36.74 kg/m  Wt Readings from Last 3 Encounters:  09/25/23 188 lb 2 oz (85.3 kg)  07/29/23 201 lb 9.6 oz (91.4 kg)  05/28/23 202 lb 8 oz (91.9 kg)      Lucius Krabbe, NP

## 2023-09-25 NOTE — Assessment & Plan Note (Signed)
 ADHD managed with Adderall. Current regimen: XR 15 mg morning, IR 5-10 mg afternoon, with dose flexibility. Reports good control of sx, no SE. - Continue Adderall XR 15 mg morning, IR 10 mg afternoon. Sending refills x 3 months. - Schedule f/u prior to needing refills, approx 3mos

## 2023-09-25 NOTE — Telephone Encounter (Signed)
 Pt scheduled for OV today at 11:00 am with PCP

## 2023-10-10 ENCOUNTER — Ambulatory Visit: Admitting: Family

## 2023-10-10 ENCOUNTER — Encounter: Payer: Self-pay | Admitting: Family

## 2023-10-10 VITALS — BP 108/73 | HR 84 | Temp 97.5°F | Ht 60.0 in | Wt 190.0 lb

## 2023-10-10 DIAGNOSIS — M5416 Radiculopathy, lumbar region: Secondary | ICD-10-CM

## 2023-10-10 MED ORDER — PREDNISONE 10 MG PO TABS: ORAL_TABLET | ORAL | 0 refills | Status: DC

## 2023-10-10 NOTE — Progress Notes (Signed)
 Patient ID: Karen Flowers, female    DOB: [birth year], 43 y.o.   MRN: [id number]  Chief Complaint  Patient presents with   Back Pain    Pt c/o back pain that radiates down right leg, present for 3 weeks. Has tried   Discussed the use of AI scribe software for clinical note transcription with the patient, who gave verbal consent to proceed.  History of Present Illness   Karen Flowers is a 43 year old female who presents with worsening back pain radiating to the right leg.  Lumbosacral radicular pain - Sharp, spasm-like back pain radiating to the right leg - Seen originally 2 weeks ago after doing extensive ground digging for a fence, pain was localized to lumbar only - Pain worsens with sitting, standing, or lying down - Movement provides some relief - No associated tingling, numbness, or dull ache  Functional impairment - Pain has affected ability to work - Job loss due to inability to perform required physical activity  Analgesic use - Ibuprofen provides more effective relief than previously prescribed medications     Assessment & Plan:     Low back pain with right lower extremity radiculopathy Worsening low back pain radiating to the right leg, likely due to nerve involvement from muscle or tissue inflammation. Differential includes herniated disc. - Refer to orthopedics for evaluation and x-ray  - Prescribe prednisone  pack to reduce inflammation. Instruct to take in the morning with breakfast to avoid sleep disturbances and manage increased hunger and irritability. - Advise to avoid ibuprofen and Aleve while on prednisone  to prevent gastrointestinal issues. Tylenol and muscle relaxers are permissible. - Instruct to drink plenty of water and eat healthy foods while on prednisone . - Advised to contact the office if not contacted by orthopedics for scheduling.     Subjective:    Outpatient Medications Prior to Visit  Medication Sig Dispense Refill   [START ON 10/26/2023]  amphetamine -dextroamphetamine  (ADDERALL XR) 15 MG 24 hr capsule Take 1 capsule by mouth every morning. 30 capsule 0   [START ON 11/25/2023] amphetamine -dextroamphetamine  (ADDERALL XR) 15 MG 24 hr capsule Take 1 capsule by mouth every morning. 30 capsule 0   amphetamine -dextroamphetamine  (ADDERALL XR) 15 MG 24 hr capsule Take 1 capsule by mouth every morning. 30 capsule 0   amphetamine -dextroamphetamine  (ADDERALL) 10 MG tablet Take 1 tablet (10 mg total) by mouth daily after lunch. Ok to cut in half if too strong. 30 tablet 0   amphetamine -dextroamphetamine  (ADDERALL) 10 MG tablet Take 1 tablet (10 mg total) by mouth daily after lunch. 30 tablet 0   [START ON 10/25/2023] amphetamine -dextroamphetamine  (ADDERALL) 10 MG tablet Take 1 tablet (10 mg total) by mouth daily after lunch. 30 tablet 0   [START ON 11/24/2023] amphetamine -dextroamphetamine  (ADDERALL) 10 MG tablet Take 1 tablet (10 mg total) by mouth daily after lunch. 30 tablet 0   cyclobenzaprine  (FLEXERIL ) 5 MG tablet Take 1-2 tablets (5-10 mg total) by mouth 3 (three) times daily as needed for muscle spasms (May cause drowsiness.). 30 tablet 1   sertraline  (ZOLOFT ) 50 MG tablet Take 1 tablet (50 mg total) by mouth daily. 90 tablet 0   diclofenac  (VOLTAREN ) 75 MG EC tablet Take 1 tablet (75 mg total) by mouth 2 (two) times daily. Take bid for 1 week, then prn. Do not take extra Ibuprofen or Aleve. 30 tablet 0   hydrOXYzine  (ATARAX ) 25 MG tablet Take 1 tablet (25 mg total) by mouth 2 (two) times daily as needed for anxiety (insomnia).  Take one 25 mg tablet 30-60 minutes prior to bedtime for insomnia, anxiety. May increase to two tablets. (Patient not taking: Reported on 07/29/2023) 60 tablet 2   No facility-administered medications prior to visit.   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   Carpal tunnel syndrome of right wrist 03/16/2011   Diverticulosis     Endometriosis    Hx of suicide attempt    Liver damage    due to past overdose of Acetaminophen   Major depressive disorder, recurrent episode, moderate (HCC) 02/10/2020   Past Surgical History:  Procedure Laterality Date   CARPAL TUNNEL RELEASE  04/12/2011   Procedure: CARPAL TUNNEL RELEASE;  Surgeon: Franky JONELLE Curia, MD;  Location: [facility];  Service: Orthopedics;  Laterality: Right;   DIAGNOSTIC LAPAROSCOPY  03/13/2004   and EUA   SALPINGECTOMY  08/24/2005   right; laparoscopic   Allergies  Allergen Reactions   Acetaminophen Other (See Comments)    LIVER DAMAGE   Latex Itching   Penicillins Other (See Comments)    YEAST INFECTION      Objective:    Physical Exam Vitals and nursing note reviewed.  Constitutional:      Appearance: Normal appearance.  Cardiovascular:     Rate and Rhythm: Normal rate and regular rhythm.  Pulmonary:     Effort: Pulmonary effort is normal.     Breath sounds: Normal breath sounds.  Musculoskeletal:     Lumbar back: Tenderness and bony tenderness present. Decreased range of motion.  Skin:    General: Skin is warm and dry.  Neurological:     Mental Status: She is alert.  Psychiatric:        Mood and Affect: Mood normal.        Behavior: Behavior normal.    BP 108/73 (BP Location: Left Arm, Patient Position: Sitting, Cuff Size: Large)   Pulse 84   Temp (!) 97.5 F (36.4 C) (Temporal)   Ht 5' (1.524 m)   Wt 190 lb (86.2 kg)   LMP 09/05/2023 (Approximate)   SpO2 99%   BMI 37.11 kg/m  Wt Readings from Last 3 Encounters:  10/10/23 190 lb (86.2 kg)  09/25/23 188 lb 2 oz (85.3 kg)  07/29/23 201 lb 9.6 oz (91.4 kg)      Lucius Krabbe, NP

## 2023-10-15 ENCOUNTER — Ambulatory Visit: Payer: Self-pay

## 2023-10-15 NOTE — Telephone Encounter (Signed)
 FYI Only or Action Required?: FYI only for provider.  Patient was last seen in primary care on 10/10/2023 by Lucius Krabbe, NP.  Called Nurse Triage reporting Back Pain.  Symptoms began about a month ago.  Interventions attempted: OTC medications: tylenol and Prescription medications: prednisone .  Symptoms are: gradually worsening.  Triage Disposition: See PCP When Office is Open (Within 3 Days)  Patient/caregiver understands and will follow disposition?:     Copied from CRM #[id number]. Topic: Clinical - Red Word Triage >> Oct 15, 2023  4:36 PM Shereese L wrote: [facility] that prompted transfer to Nurse Triage: Pain in back is getting worst and having numbness in toes Reason for Disposition  [1] MODERATE back pain (e.g., interferes with normal activities) AND [2] present > 3 days  Answer Assessment - Initial Assessment Questions 1. ONSET: When did the pain begin? (e.g., minutes, hours, days)     X 4 weeks 2. LOCATION: Where does it hurt? (upper, mid or lower back)     Low back and progressed to entire back  3. SEVERITY: How bad is the pain?  (e.g., Scale 1-10; mild, moderate, or severe)     6 or 7/10 4. PATTERN: Is the pain constant? (e.g., yes, no; constant, intermittent)      Constant small pain and large/sharp pains comes and goes depending on movement. 5. RADIATION: Does the pain shoot into your legs or somewhere else?     Right leg and into toes 6. CAUSE:  What do you think is causing the back pain?      unknown 7. BACK OVERUSE:  Any recent lifting of heavy objects, strenuous work or exercise?     unknown 8. MEDICINES: What have you taken so far for the pain? (e.g., nothing, acetaminophen, NSAIDS)     Tylenol and prednisone  9. NEUROLOGIC SYMPTOMS: Do you have any weakness, numbness, or problems with bowel/bladder control?     Right toes numb and tingling 10. OTHER SYMPTOMS: Do you have any other symptoms? (e.g., fever, abdomen pain, burning with  urination, blood in urine)       Low abd 4/10 11. PREGNANCY: Is there any chance you are pregnant? When was your last menstrual period?       na  Pt states it even hurts to lay still.  Protocols used: Back Pain-A-AH

## 2023-10-16 NOTE — Telephone Encounter (Signed)
 I returned patients call in regards to triage. I let pt know Karen Flowers sent a referral to orthopedic surgery. Patient was given information below to contact and schedule.   [facility] [address] [place], [place] [address] [phone]

## 2023-10-17 ENCOUNTER — Ambulatory Visit: Admitting: Family

## 2023-10-30 ENCOUNTER — Ambulatory Visit: Admitting: Family

## 2023-11-04 ENCOUNTER — Other Ambulatory Visit: Payer: Self-pay | Admitting: Family

## 2023-11-04 DIAGNOSIS — M545 Low back pain, unspecified: Secondary | ICD-10-CM

## 2023-12-26 ENCOUNTER — Ambulatory Visit: Admitting: Family

## 2024-01-01 ENCOUNTER — Other Ambulatory Visit: Payer: Self-pay | Admitting: Family

## 2024-01-01 DIAGNOSIS — F419 Anxiety disorder, unspecified: Secondary | ICD-10-CM

## 2024-01-15 ENCOUNTER — Ambulatory Visit: Admitting: Family

## 2024-01-15 ENCOUNTER — Encounter: Payer: Self-pay | Admitting: Family

## 2024-01-15 VITALS — BP 134/82 | HR 89 | Temp 97.5°F | Ht 60.0 in | Wt 202.0 lb

## 2024-01-15 DIAGNOSIS — F32A Depression, unspecified: Secondary | ICD-10-CM

## 2024-01-15 DIAGNOSIS — F908 Attention-deficit hyperactivity disorder, other type: Secondary | ICD-10-CM

## 2024-01-15 DIAGNOSIS — L989 Disorder of the skin and subcutaneous tissue, unspecified: Secondary | ICD-10-CM | POA: Diagnosis not present

## 2024-01-15 DIAGNOSIS — F419 Anxiety disorder, unspecified: Secondary | ICD-10-CM

## 2024-01-15 MED ORDER — AMPHETAMINE-DEXTROAMPHETAMINE 10 MG PO TABS: 10.0000 mg | ORAL_TABLET | Freq: Every day | ORAL | 0 refills | Status: AC

## 2024-01-15 MED ORDER — AMPHETAMINE-DEXTROAMPHET ER 15 MG PO CP24: 15.0000 mg | ORAL_CAPSULE | ORAL | 0 refills | Status: AC

## 2024-01-15 MED ORDER — TRIAMCINOLONE ACETONIDE 0.1 % EX CREA: 1.0000 | TOPICAL_CREAM | Freq: Two times a day (BID) | CUTANEOUS | 0 refills | Status: AC

## 2024-01-15 MED ORDER — SERTRALINE HCL 50 MG PO TABS: 50.0000 mg | ORAL_TABLET | Freq: Every day | ORAL | 1 refills | Status: AC

## 2024-01-15 NOTE — Progress Notes (Signed)
 Patient ID: Karen Flowers, female    DOB: [birth year], 43 y.o.   MRN: [id number]  Chief Complaint  Patient presents with   ADHD, adult residual type    Follow up   Skin Problem    Pt c/o bump in right side of face, present since this morning. Bump is painful and itchy.   Discussed the use of AI scribe software for clinical note transcription with the patient, who gave verbal consent to proceed.  History of Present Illness Karen Flowers is a 43 year old female who presents for ADHD & Depression follow up and with recurrent facial blister.  She has a facial blister about twice a year that is tender and itchy, then oozes clear fluid and forms a scab. One blister was previously infected and required antibiotics. She has not used topical or oral treatments and lets them resolve on her own. During pregnancies the blisters were more frequent and thought to be impetigo. She takes Zoloft  for depression which is working well with no side effects and Adderall bid for ADHD. She develops itchy welts like mosquito bites when her skin is exposed to the cold. These resolve once she is warm.  Assessment & Plan Recurrent facial skin lesion Recurrent lesion on cheek, tender, itchy, oozing clear drainage. Differential includes impetigo and contact dermatitis. No clear trigger. Previous episodes resolved spontaneously, one required antibiotics. - Prescribed triamcinolone  0.1% cream twice daily. - Advised to avoid scratching and use Band-Aid at night. - Call office if does not resolve.  Anxiety and depression Current prescription for 30 days. Insurance allows 90-day supply for Zoloft , not for Adderall. - Sent Zoloft  50mg  qd prescription to pharmacy on [address] and [place]. - F/U in 6 mos  Adult attention-deficit hyperactivity disorder Managed with Adderall, 15 mg XR in morning and 10 mg IR in afternoon, effective regimen. Denies any SE.  - Sent Adderall prescriptions to [facility] on [address] and  [address]. - F/U in 3mos virtual ok   Subjective:    Outpatient Medications Prior to Visit  Medication Sig Dispense Refill   sertraline  (ZOLOFT ) 50 MG tablet TAKE 1 TABLET BY MOUTH EVERY DAY 90 tablet 0   amphetamine -dextroamphetamine  (ADDERALL XR) 15 MG 24 hr capsule Take 1 capsule by mouth every morning. 30 capsule 0   amphetamine -dextroamphetamine  (ADDERALL XR) 15 MG 24 hr capsule Take 1 capsule by mouth every morning. 30 capsule 0   amphetamine -dextroamphetamine  (ADDERALL XR) 15 MG 24 hr capsule Take 1 capsule by mouth every morning. 30 capsule 0   amphetamine -dextroamphetamine  (ADDERALL) 10 MG tablet Take 1 tablet (10 mg total) by mouth daily after lunch. Ok to cut in half if too strong. 30 tablet 0   amphetamine -dextroamphetamine  (ADDERALL) 10 MG tablet Take 1 tablet (10 mg total) by mouth daily after lunch. 30 tablet 0   amphetamine -dextroamphetamine  (ADDERALL) 10 MG tablet Take 1 tablet (10 mg total) by mouth daily after lunch. 30 tablet 0   amphetamine -dextroamphetamine  (ADDERALL) 10 MG tablet Take 1 tablet (10 mg total) by mouth daily after lunch. 30 tablet 0   cyclobenzaprine  (FLEXERIL ) 5 MG tablet Take 1-2 tablets (5-10 mg total) by mouth 3 (three) times daily as needed for muscle spasms (May cause drowsiness.). 30 tablet 1   hydrOXYzine  (ATARAX ) 25 MG tablet Take 1 tablet (25 mg total) by mouth 2 (two) times daily as needed for anxiety (insomnia). Take one 25 mg tablet 30-60 minutes prior to bedtime for insomnia, anxiety. May increase to two tablets. (Patient  not taking: Reported on 07/29/2023) 60 tablet 2   predniSONE  (DELTASONE ) 10 MG tablet Take after breakfast:  6 tab day 1, 5 tab day 2-3, 4 tab day 4, 3 tab day 5, 2 tab day 6-7 27 tablet 0   diclofenac  (VOLTAREN ) 75 MG EC tablet TAKE 1 TABLET (75 MG TOTAL) BY MOUTH 2 (TWO) TIMES DAILY FOR 1 WEEK THEN AS NEEDED DO NOT TAKE EXTRA IBUPROFEN OR ALEVE. 30 tablet 0   No facility-administered medications prior to visit.   Past  Medical History:  Diagnosis Date   ADHD (attention deficit hyperactivity disorder), inattentive type 03/14/2020   Carpal tunnel syndrome of right wrist 03/16/2011   Diverticulosis    Endometriosis    Hx of suicide attempt    Liver damage    due to past overdose of Acetaminophen   Major depressive disorder, recurrent episode, moderate (HCC) 02/10/2020   Past Surgical History:  Procedure Laterality Date   CARPAL TUNNEL RELEASE  04/12/2011   Procedure: CARPAL TUNNEL RELEASE;  Surgeon: Franky JONELLE Curia, MD;  Location: [facility];  Service: Orthopedics;  Laterality: Right;   DIAGNOSTIC LAPAROSCOPY  03/13/2004   and EUA   SALPINGECTOMY  08/24/2005   right; laparoscopic   Allergies  Allergen Reactions   Acetaminophen Other (See Comments)    LIVER DAMAGE   Latex Itching   Penicillins Other (See Comments)    YEAST INFECTION      Objective:    Physical Exam Vitals and nursing note reviewed.  Constitutional:      Appearance: Normal appearance. She is obese.  Cardiovascular:     Rate and Rhythm: Normal rate and regular rhythm.  Pulmonary:     Effort: Pulmonary effort is normal.     Breath sounds: Normal breath sounds.  Musculoskeletal:        General: Normal range of motion.  Skin:    General: Skin is warm and dry.     Findings: Lesion (raised, flesh colored bump, approx 2cm in diameter, with scaling, no erythema on right lower cheek close to ear) present.  Neurological:     Mental Status: She is alert.  Psychiatric:        Mood and Affect: Mood normal.        Behavior: Behavior normal.    BP 134/82 (BP Location: Left Arm, Patient Position: Sitting, Cuff Size: Large)   Pulse 89   Temp (!) 97.5 F (36.4 C) (Temporal)   Ht 5' (1.524 m)   Wt 202 lb (91.6 kg)   LMP 01/07/2024 (Approximate)   SpO2 97%   BMI 39.45 kg/m  Wt Readings from Last 3 Encounters:  01/15/24 202 lb (91.6 kg)  10/10/23 190 lb (86.2 kg)  09/25/23 188 lb 2 oz (85.3 kg)      Lucius Krabbe, NP

## 2024-04-14 ENCOUNTER — Ambulatory Visit: Admitting: Family
